# Patient Record
Sex: Female | Born: 1935 | Race: Black or African American | Hispanic: No | Smoking: Never smoker
Health system: Southern US, Community
[De-identification: ages and names within clinical notes are randomized; demographics above are authoritative.]

## PROBLEM LIST (undated history)

## (undated) DIAGNOSIS — H539 Unspecified visual disturbance: Secondary | ICD-10-CM

## (undated) DIAGNOSIS — H9190 Unspecified hearing loss, unspecified ear: Secondary | ICD-10-CM

## (undated) DIAGNOSIS — E079 Disorder of thyroid, unspecified: Secondary | ICD-10-CM

## (undated) DIAGNOSIS — K219 Gastro-esophageal reflux disease without esophagitis: Secondary | ICD-10-CM

## (undated) DIAGNOSIS — I10 Essential (primary) hypertension: Secondary | ICD-10-CM

## (undated) DIAGNOSIS — J45909 Unspecified asthma, uncomplicated: Secondary | ICD-10-CM

## (undated) DIAGNOSIS — C50919 Malignant neoplasm of unspecified site of unspecified female breast: Secondary | ICD-10-CM

## (undated) DIAGNOSIS — M1990 Unspecified osteoarthritis, unspecified site: Secondary | ICD-10-CM

## (undated) DIAGNOSIS — Z923 Personal history of irradiation: Secondary | ICD-10-CM

## (undated) DIAGNOSIS — Z8619 Personal history of other infectious and parasitic diseases: Secondary | ICD-10-CM

## (undated) DIAGNOSIS — E039 Hypothyroidism, unspecified: Secondary | ICD-10-CM

## (undated) DIAGNOSIS — G709 Myoneural disorder, unspecified: Secondary | ICD-10-CM

## (undated) DIAGNOSIS — R0602 Shortness of breath: Secondary | ICD-10-CM

## (undated) DIAGNOSIS — E785 Hyperlipidemia, unspecified: Secondary | ICD-10-CM

## (undated) DIAGNOSIS — E119 Type 2 diabetes mellitus without complications: Secondary | ICD-10-CM

## (undated) DIAGNOSIS — J189 Pneumonia, unspecified organism: Secondary | ICD-10-CM

## (undated) HISTORY — DX: Myoneural disorder, unspecified: G70.9

## (undated) HISTORY — PX: ELBOW SURGERY: SHX618

## (undated) HISTORY — PX: BACK SURGERY: SHX140

## (undated) HISTORY — DX: Malignant neoplasm of unspecified site of unspecified female breast: C50.919

## (undated) HISTORY — DX: Type 2 diabetes mellitus without complications: E11.9

## (undated) HISTORY — DX: Gastro-esophageal reflux disease without esophagitis: K21.9

## (undated) HISTORY — DX: Disorder of thyroid, unspecified: E07.9

## (undated) HISTORY — PX: CARPAL TUNNEL RELEASE: SHX101

## (undated) HISTORY — DX: Essential (primary) hypertension: I10

## (undated) HISTORY — PX: CERVICAL SPINE SURGERY: SHX589

## (undated) HISTORY — DX: Personal history of irradiation: Z92.3

## (undated) HISTORY — DX: Unspecified asthma, uncomplicated: J45.909

## (undated) HISTORY — DX: Unspecified visual disturbance: H53.9

## (undated) HISTORY — DX: Unspecified hearing loss, unspecified ear: H91.90

## (undated) HISTORY — PX: ROTATOR CUFF REPAIR: SHX139

## (undated) HISTORY — DX: Hyperlipidemia, unspecified: E78.5

## (undated) HISTORY — DX: Unspecified osteoarthritis, unspecified site: M19.90

## (undated) HISTORY — PX: ABDOMINAL HYSTERECTOMY: SHX81

---

## 1998-12-11 ENCOUNTER — Ambulatory Visit (HOSPITAL_BASED_OUTPATIENT_CLINIC_OR_DEPARTMENT_OTHER): Admission: RE | Admit: 1998-12-11 | Discharge: 1998-12-11 | Payer: Self-pay | Admitting: Orthopedic Surgery

## 1999-12-24 ENCOUNTER — Ambulatory Visit (HOSPITAL_COMMUNITY): Admission: RE | Admit: 1999-12-24 | Discharge: 1999-12-24 | Payer: Self-pay

## 2000-01-06 ENCOUNTER — Encounter: Admission: RE | Admit: 2000-01-06 | Discharge: 2000-01-06 | Payer: Self-pay | Admitting: *Deleted

## 2000-01-06 ENCOUNTER — Encounter: Payer: Self-pay | Admitting: *Deleted

## 2000-02-07 ENCOUNTER — Ambulatory Visit (HOSPITAL_COMMUNITY): Admission: RE | Admit: 2000-02-07 | Discharge: 2000-02-07 | Payer: Self-pay | Admitting: *Deleted

## 2000-04-06 ENCOUNTER — Encounter: Payer: Self-pay | Admitting: *Deleted

## 2000-04-06 ENCOUNTER — Ambulatory Visit (HOSPITAL_COMMUNITY): Admission: RE | Admit: 2000-04-06 | Discharge: 2000-04-06 | Payer: Self-pay | Admitting: *Deleted

## 2000-07-15 ENCOUNTER — Ambulatory Visit (HOSPITAL_COMMUNITY): Admission: RE | Admit: 2000-07-15 | Discharge: 2000-07-15 | Payer: Self-pay | Admitting: *Deleted

## 2001-04-19 ENCOUNTER — Encounter: Payer: Self-pay | Admitting: Endocrinology

## 2001-04-19 ENCOUNTER — Ambulatory Visit (HOSPITAL_COMMUNITY): Admission: RE | Admit: 2001-04-19 | Discharge: 2001-04-19 | Payer: Self-pay | Admitting: Endocrinology

## 2002-03-22 ENCOUNTER — Encounter: Payer: Self-pay | Admitting: Emergency Medicine

## 2002-03-22 ENCOUNTER — Emergency Department (HOSPITAL_COMMUNITY): Admission: EM | Admit: 2002-03-22 | Discharge: 2002-03-22 | Payer: Self-pay | Admitting: Emergency Medicine

## 2002-06-11 ENCOUNTER — Emergency Department (HOSPITAL_COMMUNITY): Admission: EM | Admit: 2002-06-11 | Discharge: 2002-06-11 | Payer: Self-pay | Admitting: Emergency Medicine

## 2002-07-16 ENCOUNTER — Emergency Department (HOSPITAL_COMMUNITY): Admission: EM | Admit: 2002-07-16 | Discharge: 2002-07-16 | Payer: Self-pay | Admitting: Emergency Medicine

## 2002-07-16 ENCOUNTER — Encounter: Payer: Self-pay | Admitting: Emergency Medicine

## 2004-07-09 ENCOUNTER — Encounter: Admission: RE | Admit: 2004-07-09 | Discharge: 2004-07-09 | Payer: Self-pay | Admitting: Endocrinology

## 2005-05-14 ENCOUNTER — Encounter: Admission: RE | Admit: 2005-05-14 | Discharge: 2005-05-14 | Payer: Self-pay | Admitting: Neurology

## 2005-08-17 ENCOUNTER — Emergency Department (HOSPITAL_COMMUNITY): Admission: EM | Admit: 2005-08-17 | Discharge: 2005-08-17 | Payer: Self-pay | Admitting: Emergency Medicine

## 2006-06-02 ENCOUNTER — Encounter: Admission: RE | Admit: 2006-06-02 | Discharge: 2006-06-02 | Payer: Self-pay | Admitting: Orthopedic Surgery

## 2007-07-20 ENCOUNTER — Encounter: Admission: RE | Admit: 2007-07-20 | Discharge: 2007-07-20 | Payer: Self-pay | Admitting: *Deleted

## 2009-12-24 ENCOUNTER — Encounter: Admission: RE | Admit: 2009-12-24 | Discharge: 2009-12-24 | Payer: Self-pay | Admitting: Endocrinology

## 2010-05-07 ENCOUNTER — Observation Stay (HOSPITAL_COMMUNITY): Admission: EM | Admit: 2010-05-07 | Discharge: 2010-05-08 | Payer: Self-pay | Admitting: Emergency Medicine

## 2010-05-08 ENCOUNTER — Encounter (INDEPENDENT_AMBULATORY_CARE_PROVIDER_SITE_OTHER): Payer: Self-pay | Admitting: Internal Medicine

## 2010-05-28 ENCOUNTER — Encounter: Admission: RE | Admit: 2010-05-28 | Discharge: 2010-05-28 | Payer: Self-pay | Admitting: Endocrinology

## 2010-09-03 ENCOUNTER — Encounter: Admission: RE | Admit: 2010-09-03 | Discharge: 2010-09-03 | Payer: Self-pay | Admitting: Endocrinology

## 2010-10-08 ENCOUNTER — Encounter: Admission: RE | Admit: 2010-10-08 | Discharge: 2010-10-08 | Payer: Self-pay | Admitting: Endocrinology

## 2010-10-08 ENCOUNTER — Other Ambulatory Visit
Admission: RE | Admit: 2010-10-08 | Discharge: 2010-10-08 | Payer: Self-pay | Source: Home / Self Care | Admitting: Interventional Radiology

## 2010-12-28 IMAGING — CT CT CHEST W/ CM
2 of 3 series · 15 of 36 positions shown, 18 images · IV contrast (75CC OMNI 300)
Comparison: CT angio chest of 05/07/2010, chest x-ray of 05/07/2010

CLINICAL DATA: Short of breath,  questionable right hilar lesion on
chest x-ray

CT CHEST WITH CONTRAST
TECHNIQUE: Multidetector CT imaging of the chest was performed
following the standard protocol during bolus administration of
intravenous contrast.
Contrast: 75 ml 1mnipaque-HTT

[Series 2: routine chest · axial · 0.62mm/px · z∈[-178,+27]mm · 12 of 49 slices shown, 15 images]
[im 4/49  mediastinal]
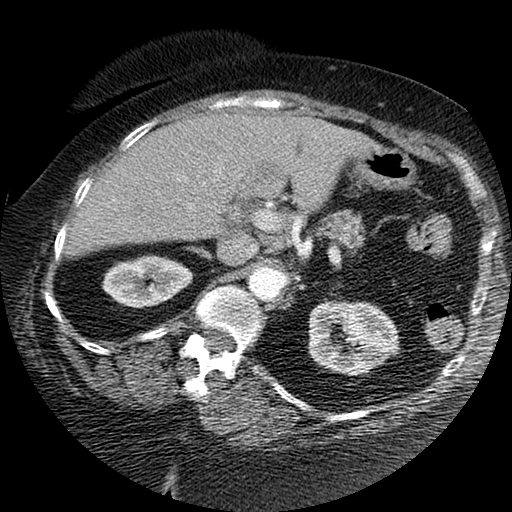
[im 4/49  lung]
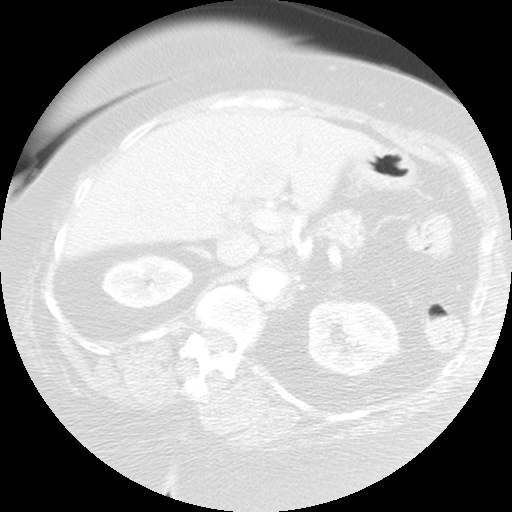
[im 8/49  lung]
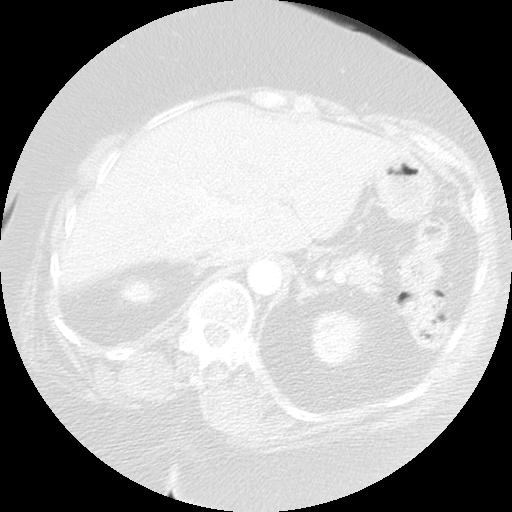
[im 11/49  lung]
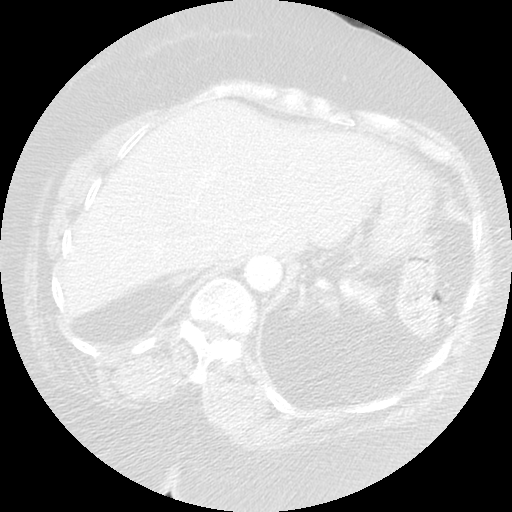
[im 15/49  lung]
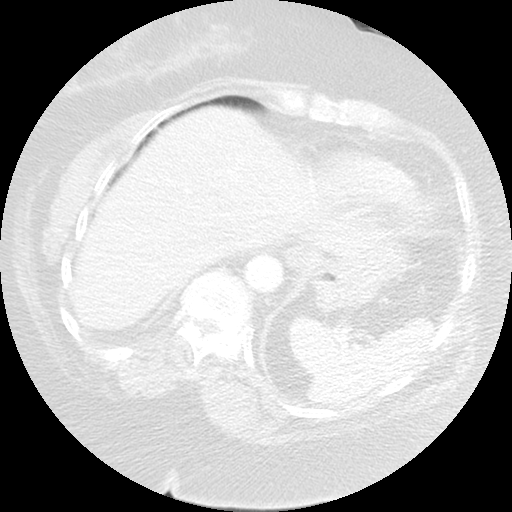
[im 18/49  mediastinal]
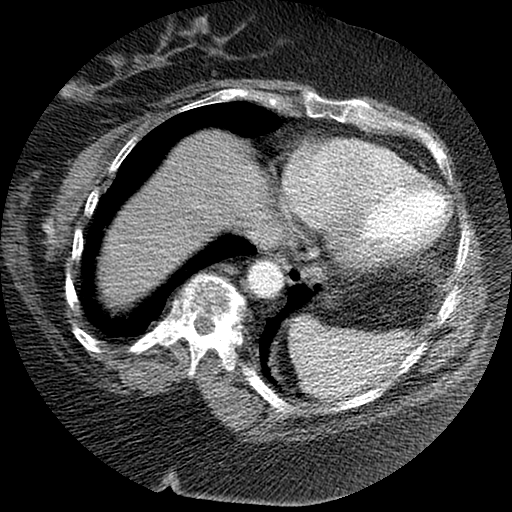
[im 18/49  lung]
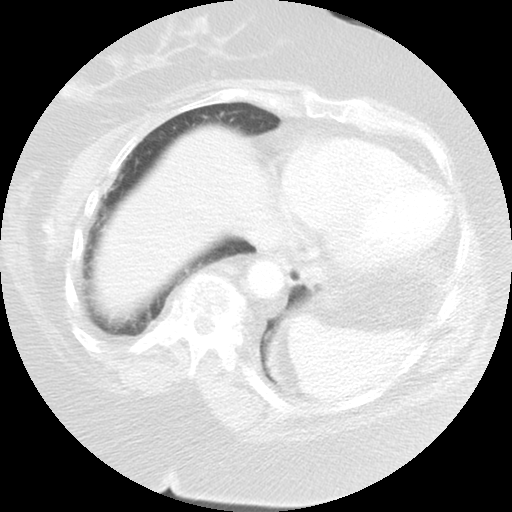
[im 22/49  lung]
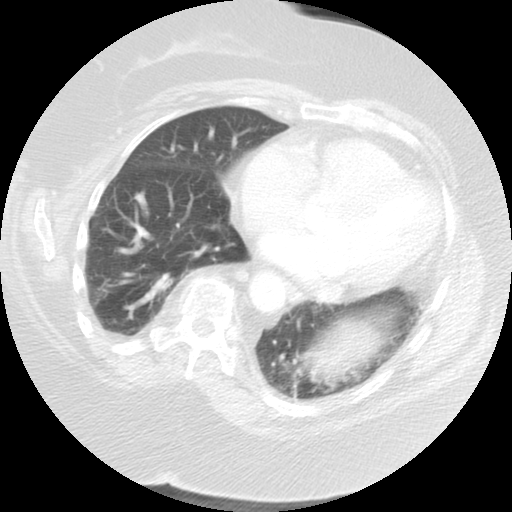
[im 27/49  lung]
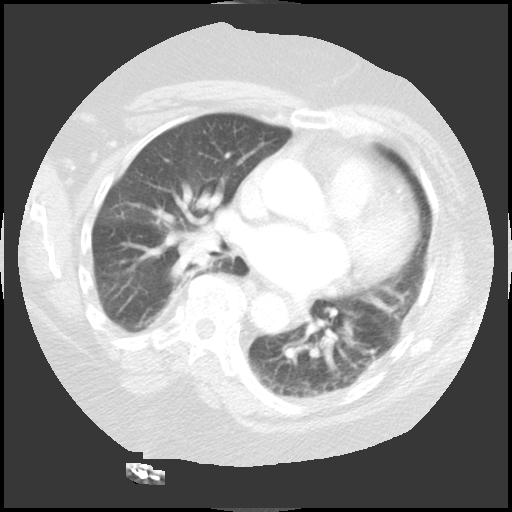
[im 31/49  lung]
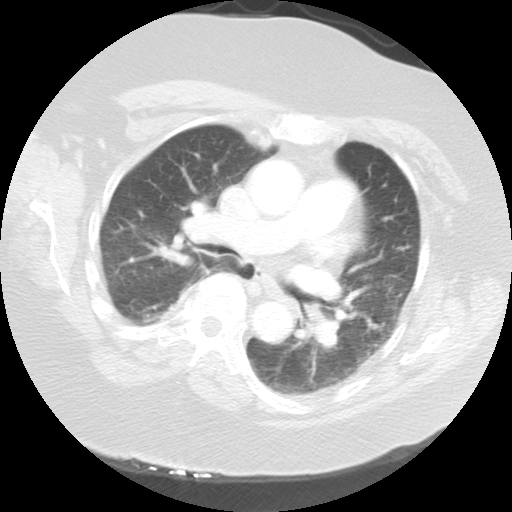
[im 34/49  mediastinal]
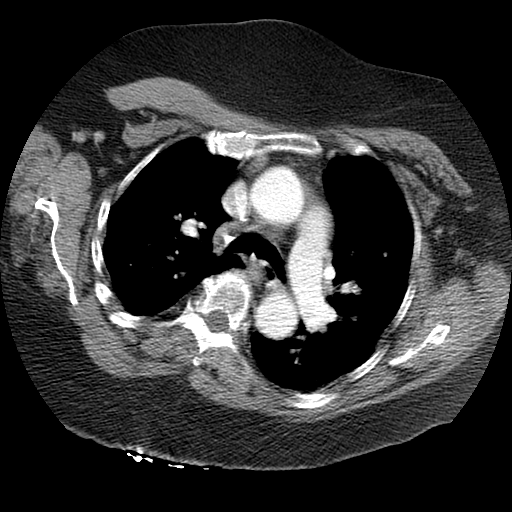
[im 34/49  lung]
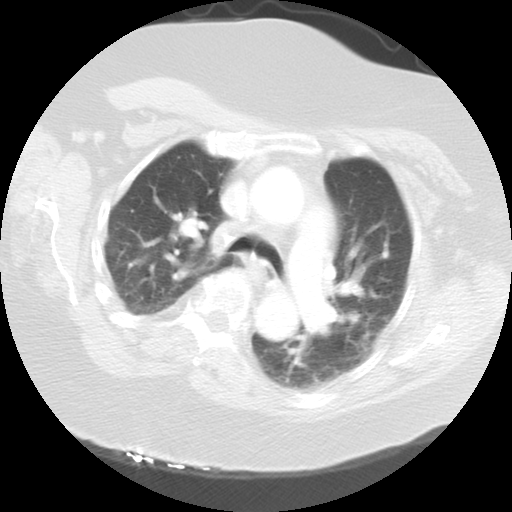
[im 38/49  lung]
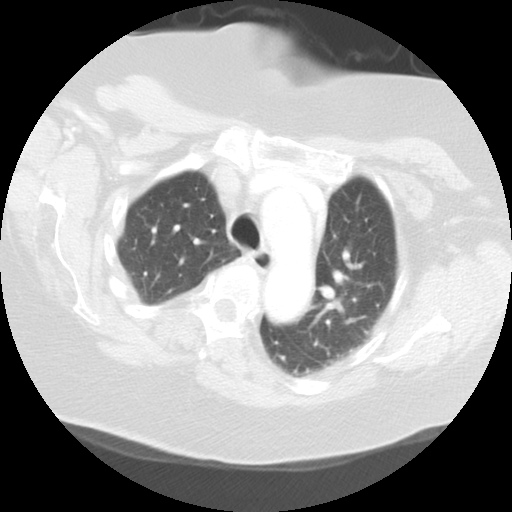
[im 41/49  lung]
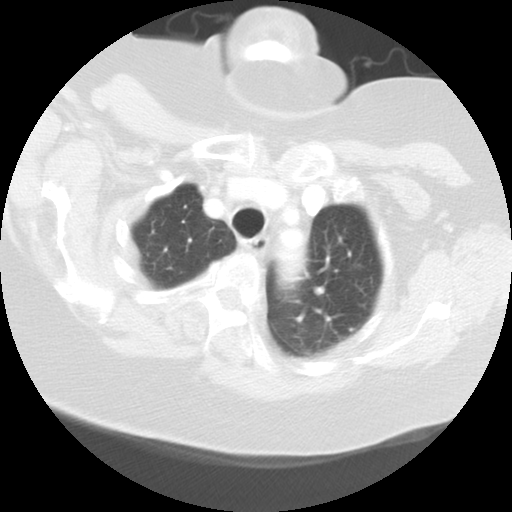
[im 45/49  lung]
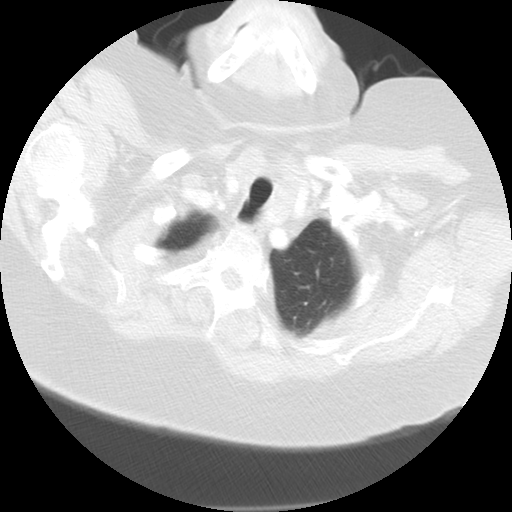

[Series 400: cor · coronal · 0.62mm/px · 3 of 120 slices shown]
[im 24/120  lung]
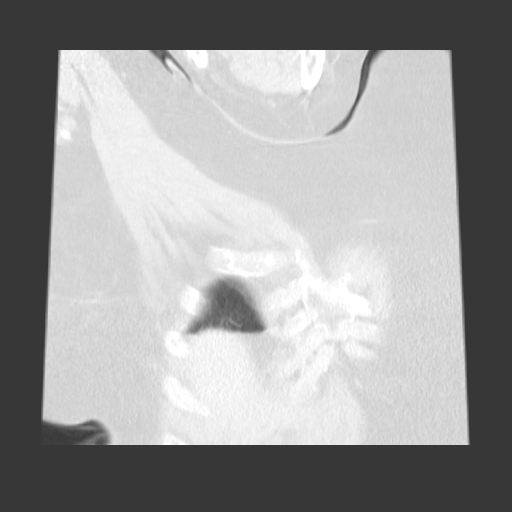
[im 48/120  lung]
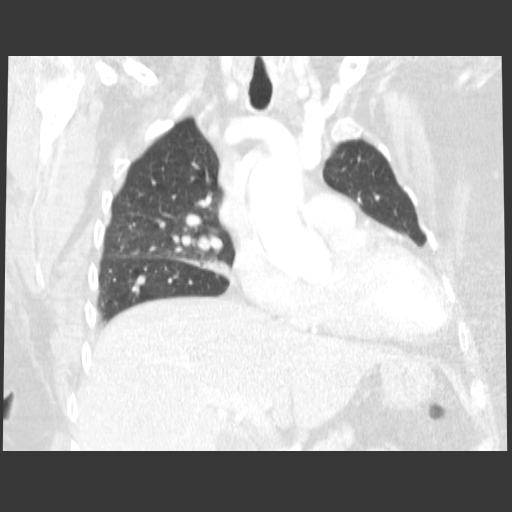
[im 72/120  lung]
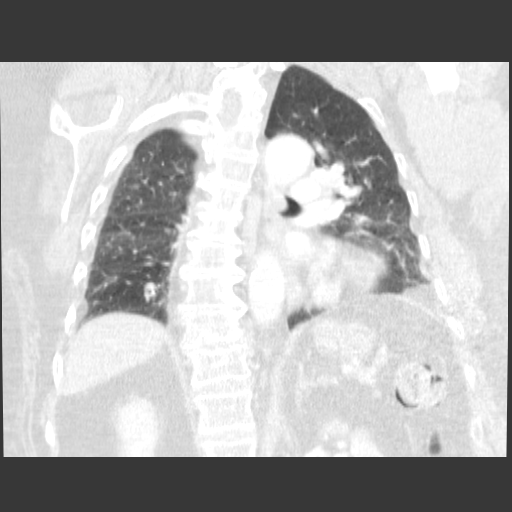

[15 of 36 positions shown; findings below may reference images not displayed]

FINDINGS: On the lung window images, no infiltrate, suspicious lung
mass, or pleural effusion is seen.  No bony abnormality is seen.

On the soft tissue window images, the pulmonary arteries and
thoracic aorta opacify with no significant abnormality noted.  No
hilar mass is seen.  There is mild cardiomegaly present.  No
mediastinal or hilar adenopathy is noted.  The upper abdomen is
unremarkable.
IMPRESSION: Negative CT of the chest.  No mass or adenopathy is seen.
Cardiomegaly.

## 2011-01-19 LAB — HEMOGLOBIN A1C
Hgb A1c MFr Bld: 6.3 % — ABNORMAL HIGH (ref <5.7)
Mean Plasma Glucose: 134 mg/dL — ABNORMAL HIGH (ref <117)

## 2011-01-19 LAB — CARDIAC PANEL(CRET KIN+CKTOT+MB+TROPI)
CK, MB: 1.1 ng/mL (ref 0.3–4.0)
CK, MB: 1.1 ng/mL (ref 0.3–4.0)
Relative Index: 0.9 (ref 0.0–2.5)

## 2011-01-19 LAB — POCT CARDIAC MARKERS
CKMB, poc: <1 ng/mL — ABNORMAL LOW (ref 1.0–8.0)
CKMB, poc: <1 ng/mL — ABNORMAL LOW (ref 1.0–8.0)
Myoglobin, poc: 139 ng/mL (ref 12–200)
Myoglobin, poc: 81.7 ng/mL (ref 12–200)
Troponin i, poc: <0.05 ng/mL (ref 0.00–0.09)
Troponin i, poc: <0.05 ng/mL (ref 0.00–0.09)

## 2011-01-19 LAB — BASIC METABOLIC PANEL
BUN: 9 mg/dL (ref 6–23)
CO2: 30 mEq/L (ref 19–32)
CO2: 32 mEq/L (ref 19–32)
Calcium: 8.9 mg/dL (ref 8.4–10.5)
Calcium: 9.4 mg/dL (ref 8.4–10.5)
Chloride: 105 mEq/L (ref 96–112)
Chloride: 108 mEq/L (ref 96–112)
Creatinine, Ser: 0.85 mg/dL (ref 0.4–1.2)
GFR calc Af Amer: >60 mL/min (ref >60)
GFR calc non Af Amer: >60 mL/min (ref >60)
Glucose, Bld: 111 mg/dL — ABNORMAL HIGH (ref 70–99)
Glucose, Bld: 123 mg/dL — ABNORMAL HIGH (ref 70–99)
Potassium: 2.9 mEq/L — ABNORMAL LOW (ref 3.5–5.1)
Potassium: 3.7 mEq/L (ref 3.5–5.1)
Sodium: 141 mEq/L (ref 135–145)
Sodium: 144 mEq/L (ref 135–145)

## 2011-01-19 LAB — DIFFERENTIAL
Basophils Absolute: 0 10*3/uL (ref 0.0–0.1)
Basophils Relative: 0 % (ref 0–1)
Eosinophils Absolute: 0.1 10*3/uL (ref 0.0–0.7)
Eosinophils Relative: 2 % (ref 0–5)
Lymphocytes Relative: 38 % (ref 12–46)
Lymphs Abs: 1.4 10*3/uL (ref 0.7–4.0)
Monocytes Absolute: 0.2 10*3/uL (ref 0.1–1.0)
Monocytes Relative: 6 % (ref 3–12)
Neutro Abs: 2 10*3/uL (ref 1.7–7.7)
Neutrophils Relative %: 53 % (ref 43–77)

## 2011-01-19 LAB — CBC
HCT: 44 % (ref 36.0–46.0)
Hemoglobin: 12.8 g/dL (ref 12.0–15.0)
Hemoglobin: 14.7 g/dL (ref 12.0–15.0)
MCH: 31.4 pg (ref 26.0–34.0)
MCHC: 33.4 g/dL (ref 30.0–36.0)
MCV: 94.1 fL (ref 78.0–100.0)
Platelets: 150 10*3/uL (ref 150–400)
RBC: 4.05 MIL/uL (ref 3.87–5.11)
RBC: 4.68 MIL/uL (ref 3.87–5.11)
RDW: 14.8 % (ref 11.5–15.5)
WBC: 3.8 10*3/uL — ABNORMAL LOW (ref 4.0–10.5)
WBC: 5 10*3/uL (ref 4.0–10.5)

## 2011-01-19 LAB — LIPID PANEL
Cholesterol: 141 mg/dL (ref 0–200)
HDL: 92 mg/dL (ref >39)
LDL Cholesterol: 35 mg/dL (ref 0–99)
Total CHOL/HDL Ratio: 1.5 RATIO
Triglycerides: 69 mg/dL (ref <150)

## 2011-01-19 LAB — CK TOTAL AND CKMB (NOT AT ARMC)
CK, MB: 1 ng/mL (ref 0.3–4.0)
Relative Index: 0.9 (ref 0.0–2.5)
Total CK: 107 U/L (ref 7–177)

## 2011-01-19 LAB — MAGNESIUM: Magnesium: 2.2 mg/dL (ref 1.5–2.5)

## 2011-01-19 LAB — GLUCOSE, CAPILLARY: Glucose-Capillary: 126 mg/dL — ABNORMAL HIGH (ref 70–99)

## 2011-01-19 LAB — BRAIN NATRIURETIC PEPTIDE: Pro B Natriuretic peptide (BNP): <30 pg/mL (ref 0.0–100.0)

## 2011-01-19 LAB — TSH: TSH: 0.1 u[IU]/mL — ABNORMAL LOW (ref 0.350–4.500)

## 2011-03-21 NOTE — Op Note (Signed)
[place]. [facility]  Patient:    Amber [name], Amber [name]                    MRN: [id number] Proc. Date: 02/07/00 Adm. Date:  [id number] Attending:  Sabino Gasser                           Operative Report  PROCEDURE:  Upper Endoscopy.  GASTROENTEROLOGIST:  Sabino Gasser, M.D.  INDICATIONS:  Abdominal pain, early satiety, weight loss.  ANESTHESIA:  Demerol 40 mg, Versed 5 mg given intravenously in divided dose.  PROCEDURE IN DETAIL:  With the patient mildly sedated and in the left lateral decubitus position, the Olympus video endoscope was inserted in the mouth, passed under direct vision through the esophagus, which appeared normal, into the stomach, which appeared normal.  The endoscope was then pulled back to the distal esophagus at the GE junction.   This area appeared normal.  The Z-line was not well seen, but no inflammatory changes were noted, and this was photographed.  We entered into the stomach further.  The antrum, duodenal bulb, second portion duodenum all appeared normal, and the ampulla was well seen and photographed as well.  The endoscope as then slowly withdrawn, taking circumferential views of the entire duodenum mucosa. The endoscope was pulled back in the stomach and placed in retroflexion to view the stomach from below, and this also appeared normal and was photographed.  The endoscope was then straightened and pulled back from distal to proximal stomach, taking circumferential views of the remaining gastric and esophageal mucosa. The patients vital signs and pulse oximetry remained stable.  The patient tolerated  the procedure well with no apparent complications.  FINDINGS:  Essentially normal endoscopic examination.  PLAN:  The patient may need ERCP to evaluate further. DD:  02/07/00 TD:  02/08/00 Job: [id number]

## 2011-03-21 NOTE — Procedures (Signed)
[facility]  Patient:    Amber [name], Amber [name]                    MRN: [id number] Proc. Date: 04/06/00 Adm. Date:  [id number] Attending:  Sabino Gasser                           Procedure Report  PROCEDURE PERFORMED:  Endoscopic retrograde cholangiopancreatography.  ENDOSCOPIST:  Sabino Gasser, M.D.  INDICATIONS FOR PROCEDURE:  Weight loss, dilated bile duct.  ANESTHESIA:  Demerol 120 mg, Versed 11.5 mg were given intravenously in divided dose.  Glucagon 0.5 mg was given intravenously during the procedure.  DESCRIPTION OF PROCEDURE:  With the patient mildly sedated in room 7 of radiology in the left prone position, the Olympus sideviewing duodenoscope was inserted in the mouth and passed under direct vision through the esophagus into the stomach.  The antrum was approached and appeared normal.  We advanced to the duodenal bulb and second portion of the duodenum where the endoscope was straightened and pulled back to approach the papilla of Vater.  Once accomplished, a Wilson-Cook tritome catheter was advanced and cannulation of the duct occurred.  We injected contrast material and it first showed pancreatic duct and after a number of times of trying to attempt to pass a guide wire and with change in position of the endoscope, we were subsequently able to inject contrast materials to outline the common bile duct which appeared grossly normal.  We passed a guide wire and followed it with the catheter, injected constrast material, then under the guidance of Dr. Stevphen Meuse of radiology and a normal outline of the biliary tree occurred other than gallstones.  The common bile duct specifically appeared normal without filling defects or strictures.  At that point, the endoscope, guide wire and catheter were all removed and it was noted that there was some blood seen in the stomach.  Therefore with removal of this endoscope, a second procedure  was performed.  FINDINGS AT THIS TIME:  Essentially normal common bile duct and pancreatic duct without evidence of dilation on ERCP.  PLAN:  Will have the patient follow up with me as an outpatient, proceed with endoscopy as well. DD:  04/06/00 TD:  04/08/00 Job: [id number]

## 2011-03-21 NOTE — Procedures (Signed)
[facility]  Patient:    Amber [name], Amber [name]                    MRN: [id number] Proc. Date: 07/15/00 Adm. Date:  [id number] Attending:  Sabino Gasser                           Procedure Report  PROCEDURE PERFORMED:  Colonoscopy.  ENDOSCOPIST:  Sabino Gasser, M.D.  INDICATIONS FOR PROCEDURE:  Rectal bleeding.  ANESTHESIA:  Demerol 90 mg, Versed 9 mg.  DESCRIPTION OF PROCEDURE:  With the patient mildly sedated in the left lateral decubitus position, rectal examination was performed which revealed hemorrhoids.  Subsequently, the videoscopic colonoscope was inserted in the rectum and passed under direct vision into the cecum.  The cecum was identified by the ileocecal valve and appendiceal orifice, both of which were photographed.  From this point, the colonoscope was slowly withdrawn, taking circumferential views of the entire colonic mucosa, stopping in the rectum, which appeared normal on direct view and showed internal hemorrhoids on retroflex view.  The endoscope was straightened and withdrawn.  Patients vital signs and pulse oximeter remained stable.  The patient tolerated the procedure well and without apparent complications.  FINDINGS:  Internal hemorrhoids.  Otherwise unremarkable colonoscopic examination to the cecum.  PLAN:  Follow-up with me as needed. DD:  07/15/00 TD:  07/16/00 Job: [id number]

## 2011-03-21 NOTE — Procedures (Signed)
[facility]  Patient:    Amber [name], Amber [name]                    MRN: [id number] Adm. Date:  [id number] Attending:  Sabino Gasser                           Procedure Report  PROCEDURE:  Upper endoscopy.  ENDOSCOPIST:  Sabino Gasser, M.D.  INDICATION:  GI bleeding.  ANESTHESIA:  None for the given procedure.  DESCRIPTION OF PROCEDURE:  With patient mildly sedated in the left lateral decubitus position, the Olympus videoscopic endoscope was inserted in the mouth, passed under direct vision through the esophagus, which showed traces of some blood.  We entered into the stomach; there was some blood seen in the fundus but GE junction appeared normal without evidence of tear.  The endoscope was then advanced after straightening to view the pylorus, duodenal bulb, second portion of duodenum; no blood was seen here.  The endoscope was then withdrawn, taking circumferential views of the duodenal, gastric and esophageal mucosa and in the pharynx as well.  No bleeding was seen actively. There was some blood in the esophagus and in the stomach, presumed from mild trauma from passage of the scope.  Patients vital signs and pulse oximetry remained stable.  Patient tolerated the procedure well without apparent complications.  FINDINGS:  Minimal amount of blood in stomach, presumably from mouth trauma from passage of the side-viewing scope or tightness of the bite block.  PLAN:  No specific followup indicated at this time. DD:  04/06/00 TD:  04/08/00 Job: [id number]

## 2011-11-04 DIAGNOSIS — C50919 Malignant neoplasm of unspecified site of unspecified female breast: Secondary | ICD-10-CM

## 2011-11-04 HISTORY — DX: Malignant neoplasm of unspecified site of unspecified female breast: C50.919

## 2012-10-19 ENCOUNTER — Other Ambulatory Visit: Payer: Self-pay | Admitting: Radiology

## 2012-10-22 ENCOUNTER — Encounter (INDEPENDENT_AMBULATORY_CARE_PROVIDER_SITE_OTHER): Payer: Self-pay | Admitting: General Surgery

## 2012-10-22 ENCOUNTER — Ambulatory Visit (INDEPENDENT_AMBULATORY_CARE_PROVIDER_SITE_OTHER): Payer: Medicare Other | Admitting: General Surgery

## 2012-10-22 VITALS — BP 132/80 | HR 74 | Temp 97.4°F | Resp 16 | Ht 62.0 in | Wt 192.4 lb

## 2012-10-22 DIAGNOSIS — C50919 Malignant neoplasm of unspecified site of unspecified female breast: Secondary | ICD-10-CM

## 2012-10-22 NOTE — Progress Notes (Signed)
Patient ID: Amber [name], female   DOB: [birth year], 78 y.o.   MRN: [id number]  Chief Complaint  Patient presents with  . Breast Cancer    HPI Amber [name] is a 78 y.o. female.  this patient is referred by [facility] breast imaging for evaluation of a newly diagnosed right breast cancer. This was found on routine screening mammograms earlier this week. She says that she has not noticed any masses in the breast or any changes with her breast but she does not routinely do self breast exams either. Her last mammogram was about 4 years prior to this. She has lost about 20 pounds in the last year and complains of a lot of arthritis but no abnormal headaches. She is not on any hormone pills and she denies any family history of breast cancer. Her mother did have uterine cancer she did have a brother with oral cancer but no breast or ovarian.  She also had a biopsy of of a right axillary lymph node which was positive for metastatic carcinoma as well.HPI  Past Medical History  Diagnosis Date  . Arthritis   . Asthma   . Cancer     breast  . Diabetes mellitus without complication   . GERD (gastroesophageal reflux disease)   . Hyperlipidemia   . Hypertension   . Neuromuscular disorder   . Thyroid disease   . Hearing loss   . Visual disturbance     Past Surgical History  Procedure Date  . Back surgery 1980 - approximate  . Carpal tunnel release unsure of date    both hands  . Rotator cuff repair unsure of date    not sure which shoulder  . Elbow surgery unsure of date  . Cervical spine surgery unsure of date    Family History  Problem Relation Age of Onset  . Cancer Mother     uterine  . Cancer Brother     oral, throat    Social History History  Substance Use Topics  . Smoking status: Never Smoker   . Smokeless tobacco: Never Used  . Alcohol Use: No    No Known Allergies  Current Outpatient Prescriptions  Medication Sig Dispense Refill  . ADVAIR DISKUS 250-50 MCG/DOSE  AEPB as needed.      Marland Kitchen amLODipine (NORVASC) 5 MG tablet Daily.      . diazepam (VALIUM) 5 MG tablet Three times daily as needed.      . furosemide (LASIX) 80 MG tablet Daily.      . potassium chloride SA (K-DUR,KLOR-CON) 20 MEQ tablet Daily.      . pravastatin (PRAVACHOL) 40 MG tablet Daily.      Marland Kitchen PROAIR HFA 108 (90 BASE) MCG/ACT inhaler as needed.      . promethazine (PHENERGAN) 25 MG tablet Take 25 mg by mouth every 12 (twelve) hours as needed. Take 1/2 or 1 whole table per dosage as needed.      . propranolol (INDERAL) 40 MG tablet Daily.      Marland Kitchen SYNTHROID 150 MCG tablet Daily.      . traMADol (ULTRAM) 50 MG tablet Every 6 hours as needed.        Review of Systems Review of Systems All other review of systems negative or noncontributory except as stated in the HPI ] Blood pressure 132/80, pulse 74, temperature 97.4 F (36.3 C), temperature source Temporal, resp. rate 16, height 5\' 2"  (1.575 m), weight 192 lb 6 oz (87.261 kg).  Physical Exam  Physical Exam Physical Exam  Nursing note and vitals reviewed. Constitutional: She is oriented to person, place, and time. She appears well-developed and well-nourished. No distress.  HENT:  Head: Normocephalic and atraumatic.  Mouth/Throat: No oropharyngeal exudate.  Eyes: Conjunctivae and EOM are normal. Pupils are equal, round, and reactive to light. Right eye exhibits no discharge. Left eye exhibits no discharge. No scleral icterus.  Neck: Normal range of motion. Neck supple. No tracheal deviation present.  Cardiovascular: Normal rate, regular rhythm, normal heart sounds and intact distal pulses.   Pulmonary/Chest: Effort normal and breath sounds normal. No stridor. No respiratory distress. She has no wheezes.  Abdominal: Soft. Bowel sounds are normal. She exhibits no distension and no mass. There is no tenderness. There is no rebound and no guarding.  Musculoskeletal: Normal range of motion. She exhibits no edema and no tenderness.   Neurological: She is alert and oriented to person, place, and time.  Skin: Skin is warm and dry. No rash noted. She is not diaphoretic. No erythema. No pallor.  Psychiatric: She has a normal mood and affect. Her behavior is normal. Judgment and thought content normal.  Breast: left breast has some bruisiing and tenderness in the RUO quadrant and I do think that I can feel a mass in this area but exam is limited by discomfort.  She also has some nodularity in the axilla likely consistend with LAD.   The right breast does not show any suspicious skin changes or masses or LAD.   Data Reviewed Path, mammo, [place] images  Assessment    Right breast cancer She has a known right breast cancer with a positive axillary node. Her receptor status is still pending. This is not a straightforward case given her lymph node positivity and her age and functional status. We had a long discussion regarding the surgical options for treatment.  We also discussed the options of chemo, hormones, and radiation.  I explained that the standard guidelines would be for either lumpectomy with ALND or MRM but given her age and comorbidities, I am not sure that this is the best option for her.  I have recommended staging workup given nodal disease and if positive for mets, then I would probably not do any surgery at all.  If negative, we will need to consider lumpectomy with or without nodal dissection with adjuvant treatments such as hormones and or radiation.  Receptor status still pending.  We will discuss her at breast conference and with medical and radiation oncology.   She did have 2 daughters and her son present with her.  She did authorize me to discuss her care with her daughters and son.    Plan    Await staging workup and receptor status.  Discuss with medical and radiation oncology       Lodema Pilot DAVID 10/22/2012, 11:46 AM

## 2012-10-28 ENCOUNTER — Other Ambulatory Visit (INDEPENDENT_AMBULATORY_CARE_PROVIDER_SITE_OTHER): Payer: Self-pay

## 2012-10-28 DIAGNOSIS — D0590 Unspecified type of carcinoma in situ of unspecified breast: Secondary | ICD-10-CM

## 2012-10-29 ENCOUNTER — Other Ambulatory Visit (INDEPENDENT_AMBULATORY_CARE_PROVIDER_SITE_OTHER): Payer: Self-pay

## 2012-10-29 ENCOUNTER — Telehealth (INDEPENDENT_AMBULATORY_CARE_PROVIDER_SITE_OTHER): Payer: Self-pay | Admitting: General Surgery

## 2012-10-29 DIAGNOSIS — C50919 Malignant neoplasm of unspecified site of unspecified female breast: Secondary | ICD-10-CM

## 2012-10-29 NOTE — Telephone Encounter (Signed)
Called patient to give her appt date and time for PET scan which is 11/10/12 @10 :15 and NPO 6hrs prior...gave patient instructions and phone number to [facility] radiology if the appt turned out not to work with her schd..patient was ok with instructions and said that she would let us know if she had to R/S her appt

## 2012-11-01 ENCOUNTER — Other Ambulatory Visit (INDEPENDENT_AMBULATORY_CARE_PROVIDER_SITE_OTHER): Payer: Self-pay

## 2012-11-01 DIAGNOSIS — C50919 Malignant neoplasm of unspecified site of unspecified female breast: Secondary | ICD-10-CM

## 2012-11-09 ENCOUNTER — Ambulatory Visit
Admission: RE | Admit: 2012-11-09 | Discharge: 2012-11-09 | Disposition: A | Discharge status: Home / Self Care | Payer: Medicare Other | Source: Ambulatory Visit | Attending: General Surgery | Admitting: General Surgery

## 2012-11-09 DIAGNOSIS — C50919 Malignant neoplasm of unspecified site of unspecified female breast: Secondary | ICD-10-CM

## 2012-11-09 MED ORDER — GADOBENATE DIMEGLUMINE 529 MG/ML IV SOLN
17.0000 mL | Freq: Once | INTRAVENOUS | Status: AC | PRN
  Administered 2012-11-09: 17 mL via INTRAVENOUS

## 2012-11-10 ENCOUNTER — Other Ambulatory Visit (INDEPENDENT_AMBULATORY_CARE_PROVIDER_SITE_OTHER): Payer: Self-pay | Admitting: General Surgery

## 2012-11-10 ENCOUNTER — Telehealth: Payer: Self-pay | Admitting: *Deleted

## 2012-11-10 ENCOUNTER — Ambulatory Visit (HOSPITAL_COMMUNITY): Payer: Medicare PPO

## 2012-11-10 DIAGNOSIS — C50911 Malignant neoplasm of unspecified site of right female breast: Secondary | ICD-10-CM

## 2012-11-10 NOTE — Progress Notes (Signed)
I discussed the MRI findings with the patient's daughter and we again discussed the options.  We have discussed her care at breast conference this am and we have decided to proceed with right lumpectomy with excision of the positive node and limited axillary dissection.  She will then receive radiation to breast and axilla.  We did not feel that given her age and DM that full axillary dissection would confer much benefit and risk lymphedema or nerve injury.  She agreed with this plan.  I have also discussed this with Dr. Tilda Burrow who agrees with the plan and will localize the axillary lymph node for excision.

## 2012-11-10 NOTE — Telephone Encounter (Signed)
Confirmed 11/22/12 appt w/ pt's daughter Steward Drone.  Mailed before appt letter & packet to pt.  Emailed Christy at [facility] to make aware.  Took

## 2012-11-10 NOTE — Telephone Encounter (Signed)
Confirmed 11/22/12 appt w/ pt's daughter Steward Drone.  Mailed before appt letter & packet to pt.  Emailed Christy at [facility] to make aware.  Took paperwork to Med Rec for chart.

## 2012-11-12 ENCOUNTER — Telehealth (INDEPENDENT_AMBULATORY_CARE_PROVIDER_SITE_OTHER): Payer: Self-pay | Admitting: General Surgery

## 2012-11-12 ENCOUNTER — Telehealth: Payer: Self-pay | Admitting: *Deleted

## 2012-11-12 NOTE — Telephone Encounter (Signed)
Pt's son calling from out-of-town for with questions for Dr. Biagio Quint on his mother's plan of care.  He attended her initial appt where certain plans were made.  His sister called him to let him know about the surgery scheduled for 11/22/12.  He has questions for Dr. Biagio Quint about this, as his sister could not remember a lot of what was said.  He was asking about the whole body scan that was cancelled, etc.  Please call son:  [phone]

## 2012-11-12 NOTE — Telephone Encounter (Signed)
Pt's daughter Steward Drone) called stating that she is scheduled to have surgery on the same day she is scheduled to see Dr. Welton Flakes.  Rescheduled and confirmed 12/06/12 appt w/ pts daugther Steward Drone).  Emailed Christy at [facility] to make her aware.

## 2012-11-15 ENCOUNTER — Encounter (HOSPITAL_COMMUNITY): Payer: Self-pay | Admitting: Pharmacy Technician

## 2012-11-16 ENCOUNTER — Encounter (HOSPITAL_COMMUNITY)
Admission: RE | Admit: 2012-11-16 | Discharge: 2012-11-16 | Disposition: A | Discharge status: Home / Self Care | Payer: Medicare PPO | Source: Ambulatory Visit | Attending: General Surgery | Admitting: General Surgery

## 2012-11-16 ENCOUNTER — Other Ambulatory Visit: Payer: Self-pay | Admitting: *Deleted

## 2012-11-16 DIAGNOSIS — C50919 Malignant neoplasm of unspecified site of unspecified female breast: Secondary | ICD-10-CM | POA: Insufficient documentation

## 2012-11-16 DIAGNOSIS — C50419 Malignant neoplasm of upper-outer quadrant of unspecified female breast: Secondary | ICD-10-CM

## 2012-11-16 DIAGNOSIS — C50411 Malignant neoplasm of upper-outer quadrant of right female breast: Secondary | ICD-10-CM | POA: Insufficient documentation

## 2012-11-16 LAB — GLUCOSE, CAPILLARY: Glucose-Capillary: 102 mg/dL — ABNORMAL HIGH (ref 70–99)

## 2012-11-16 MED ORDER — FLUDEOXYGLUCOSE F - 18 (FDG) INJECTION
18.9000 | Freq: Once | INTRAVENOUS | Status: AC | PRN
  Administered 2012-11-16: 18.9 via INTRAVENOUS

## 2012-11-18 ENCOUNTER — Encounter (HOSPITAL_COMMUNITY)
Admission: RE | Admit: 2012-11-18 | Discharge: 2012-11-18 | Disposition: A | Discharge status: Home / Self Care | Payer: Medicare PPO | Source: Ambulatory Visit | Attending: General Surgery | Admitting: General Surgery

## 2012-11-18 ENCOUNTER — Encounter (HOSPITAL_COMMUNITY): Payer: Self-pay

## 2012-11-18 VITALS — BP 125/62 | HR 69 | Temp 97.8°F | Resp 20 | Ht 62.0 in | Wt 206.0 lb

## 2012-11-18 DIAGNOSIS — C50911 Malignant neoplasm of unspecified site of right female breast: Secondary | ICD-10-CM

## 2012-11-18 HISTORY — DX: Hypothyroidism, unspecified: E03.9

## 2012-11-18 LAB — BASIC METABOLIC PANEL
Calcium: 10.1 mg/dL (ref 8.4–10.5)
GFR calc Af Amer: 67 mL/min — ABNORMAL LOW (ref >90)
GFR calc non Af Amer: 58 mL/min — ABNORMAL LOW (ref >90)
Glucose, Bld: 111 mg/dL — ABNORMAL HIGH (ref 70–99)
Sodium: 147 mEq/L — ABNORMAL HIGH (ref 135–145)

## 2012-11-18 LAB — CBC
MCH: 31.3 pg (ref 26.0–34.0)
Platelets: 177 10*3/uL (ref 150–400)
RBC: 4.67 MIL/uL (ref 3.87–5.11)

## 2012-11-18 LAB — SURGICAL PCR SCREEN
MRSA, PCR: NEGATIVE
Staphylococcus aureus: NEGATIVE

## 2012-11-18 NOTE — Telephone Encounter (Signed)
Have you spoke to patient or her son?  Please advise if I still need to call him or not.

## 2012-11-18 NOTE — Pre-Procedure Instructions (Signed)
Amber [name]  11/18/2012   Your procedure is scheduled on:  Monday November 22, 2012  Report to [facility] at 1215 PM.  Call this number if you have problems the morning of surgery: [phone]   Remember:   Do not eat food or drink liquids after midnight.   Take these medicines the morning of surgery with A SIP OF WATER: Amlodipine, Diazepam if needed, Propranolol, Synthyroid and Tramadol if needed. Bring inhalers Advair and Proair.   Do not wear jewelry, make-up or nail polish.  Do not wear lotions, powders, or perfumes. You may wear deodorant.  Do not shave 48 hours prior to surgery.   Do not bring valuables to the hospital.  Contacts, dentures or bridgework may not be worn into surgery.  Leave suitcase in the car. After surgery it may be brought to your room.  For patients admitted to the hospital, checkout time is 11:00 AM the day of  discharge.   Patients discharged the day of surgery will not be allowed to drive  home.    Special Instructions: Shower using CHG 2 nights before surgery and the night before surgery.  If you shower the day of surgery use CHG.  Use special wash - you have one bottle of CHG for all showers.  You should use approximately 1/3 of the bottle for each shower.   Please read over the following fact sheets that you were given: Pain Booklet, Coughing and Deep Breathing, MRSA Information and Surgical Site Infection Prevention

## 2012-11-19 ENCOUNTER — Telehealth (INDEPENDENT_AMBULATORY_CARE_PROVIDER_SITE_OTHER): Payer: Self-pay

## 2012-11-19 NOTE — Telephone Encounter (Signed)
Pts daughter Steward Drone called requesting PET result from Tuesday. Please call [phone] with result.

## 2012-11-21 MED ORDER — HEPARIN SODIUM (PORCINE) 5000 UNIT/ML IJ SOLN
5000.0000 [IU] | Freq: Once | INTRAMUSCULAR | Status: AC
  Administered 2012-11-22: 5000 [IU] via SUBCUTANEOUS
  Filled 2012-11-21: qty 1

## 2012-11-21 MED ORDER — CEFAZOLIN SODIUM-DEXTROSE 2-3 GM-% IV SOLR
2.0000 g | INTRAVENOUS | Status: AC
  Administered 2012-11-22: 2 g via INTRAVENOUS
  Filled 2012-11-21: qty 50

## 2012-11-22 ENCOUNTER — Ambulatory Visit: Payer: Medicare Other | Admitting: Oncology

## 2012-11-22 ENCOUNTER — Ambulatory Visit: Payer: Medicare Other

## 2012-11-22 ENCOUNTER — Encounter (HOSPITAL_COMMUNITY): Payer: Self-pay | Admitting: Certified Registered Nurse Anesthetist

## 2012-11-22 ENCOUNTER — Ambulatory Visit (HOSPITAL_COMMUNITY): Payer: Medicare PPO | Admitting: Certified Registered Nurse Anesthetist

## 2012-11-22 ENCOUNTER — Other Ambulatory Visit: Payer: Medicare Other | Admitting: Lab

## 2012-11-22 ENCOUNTER — Encounter (HOSPITAL_COMMUNITY)
Admission: RE | Disposition: A | Discharge status: Home / Self Care | Payer: Self-pay | Source: Ambulatory Visit | Attending: General Surgery

## 2012-11-22 ENCOUNTER — Encounter (HOSPITAL_COMMUNITY): Payer: Self-pay | Admitting: *Deleted

## 2012-11-22 ENCOUNTER — Ambulatory Visit (HOSPITAL_COMMUNITY)
Admission: RE | Admit: 2012-11-22 | Discharge: 2012-11-23 | Disposition: A | Discharge status: Home / Self Care | Payer: Medicare PPO | Source: Ambulatory Visit | Attending: General Surgery | Admitting: General Surgery

## 2012-11-22 DIAGNOSIS — Z01818 Encounter for other preprocedural examination: Secondary | ICD-10-CM | POA: Insufficient documentation

## 2012-11-22 DIAGNOSIS — C50419 Malignant neoplasm of upper-outer quadrant of unspecified female breast: Secondary | ICD-10-CM | POA: Insufficient documentation

## 2012-11-22 DIAGNOSIS — C50911 Malignant neoplasm of unspecified site of right female breast: Secondary | ICD-10-CM

## 2012-11-22 DIAGNOSIS — Z01812 Encounter for preprocedural laboratory examination: Secondary | ICD-10-CM | POA: Insufficient documentation

## 2012-11-22 DIAGNOSIS — C50919 Malignant neoplasm of unspecified site of unspecified female breast: Secondary | ICD-10-CM

## 2012-11-22 DIAGNOSIS — Z01810 Encounter for preprocedural cardiovascular examination: Secondary | ICD-10-CM | POA: Insufficient documentation

## 2012-11-22 HISTORY — PX: BREAST LUMPECTOMY WITH NEEDLE LOCALIZATION AND AXILLARY LYMPH NODE DISSECTION: SHX5758

## 2012-11-22 HISTORY — DX: Shortness of breath: R06.02

## 2012-11-22 LAB — GLUCOSE, CAPILLARY: Glucose-Capillary: 104 mg/dL — ABNORMAL HIGH (ref 70–99)

## 2012-11-22 SURGERY — BREAST LUMPECTOMY WITH NEEDLE LOCALIZATION AND AXILLARY LYMPH NODE DISSECTION
Anesthesia: General | Site: Breast | Laterality: Right | Wound class: Clean

## 2012-11-22 MED ORDER — LIDOCAINE-EPINEPHRINE (PF) 1 %-1:200000 IJ SOLN
INTRAMUSCULAR | Status: DC | PRN
  Administered 2012-11-22: 17:00:00 via INTRAMUSCULAR

## 2012-11-22 MED ORDER — LIDOCAINE-EPINEPHRINE (PF) 1 %-1:200000 IJ SOLN
INTRAMUSCULAR | Status: AC
  Filled 2012-11-22: qty 10

## 2012-11-22 MED ORDER — BUPIVACAINE HCL (PF) 0.25 % IJ SOLN
INTRAMUSCULAR | Status: AC
  Filled 2012-11-22: qty 30

## 2012-11-22 MED ORDER — ALBUTEROL SULFATE HFA 108 (90 BASE) MCG/ACT IN AERS
2.0000 | INHALATION_SPRAY | RESPIRATORY_TRACT | Status: DC | PRN
  Filled 2012-11-22: qty 6.7

## 2012-11-22 MED ORDER — MORPHINE SULFATE 2 MG/ML IJ SOLN: 2.0000 mg | INTRAMUSCULAR | Status: DC | PRN

## 2012-11-22 MED ORDER — LEVOTHYROXINE SODIUM 150 MCG PO TABS
150.0000 ug | ORAL_TABLET | Freq: Every day | ORAL | Status: DC
  Administered 2012-11-23: 150 ug via ORAL
  Filled 2012-11-22 (×2): qty 1

## 2012-11-22 MED ORDER — HYDROMORPHONE HCL PF 1 MG/ML IJ SOLN
0.2500 mg | INTRAMUSCULAR | Status: DC | PRN
  Administered 2012-11-22: 0.5 mg via INTRAVENOUS

## 2012-11-22 MED ORDER — HYDROMORPHONE HCL PF 1 MG/ML IJ SOLN
INTRAMUSCULAR | Status: AC
  Administered 2012-11-22: 0.5 mg via INTRAVENOUS
  Filled 2012-11-22: qty 1

## 2012-11-22 MED ORDER — MOMETASONE FURO-FORMOTEROL FUM 100-5 MCG/ACT IN AERO
2.0000 | INHALATION_SPRAY | Freq: Two times a day (BID) | RESPIRATORY_TRACT | Status: DC
  Administered 2012-11-22 – 2012-11-23 (×2): 2 via RESPIRATORY_TRACT
  Filled 2012-11-22: qty 8.8

## 2012-11-22 MED ORDER — HYDROCODONE-ACETAMINOPHEN 5-325 MG PO TABS
1.0000 | ORAL_TABLET | ORAL | Status: DC | PRN
  Administered 2012-11-22: 2 via ORAL
  Administered 2012-11-23: 1 via ORAL
  Administered 2012-11-23: 2 via ORAL
  Filled 2012-11-22 (×2): qty 1
  Filled 2012-11-22: qty 2
  Filled 2012-11-22: qty 1

## 2012-11-22 MED ORDER — OXYCODONE HCL 5 MG PO TABS
ORAL_TABLET | ORAL | Status: AC
  Administered 2012-11-22: 5 mg via ORAL
  Filled 2012-11-22: qty 1

## 2012-11-22 MED ORDER — 0.9 % SODIUM CHLORIDE (POUR BTL) OPTIME
TOPICAL | Status: DC | PRN
  Administered 2012-11-22: 1000 mL

## 2012-11-22 MED ORDER — PROMETHAZINE HCL 25 MG/ML IJ SOLN: 6.2500 mg | INTRAMUSCULAR | Status: DC | PRN

## 2012-11-22 MED ORDER — LIDOCAINE HCL (CARDIAC) 20 MG/ML IV SOLN
INTRAVENOUS | Status: DC | PRN
  Administered 2012-11-22: 70 mg via INTRAVENOUS

## 2012-11-22 MED ORDER — CEFAZOLIN SODIUM 1-5 GM-% IV SOLN
1.0000 g | Freq: Three times a day (TID) | INTRAVENOUS | Status: DC
  Administered 2012-11-22 – 2012-11-23 (×2): 1 g via INTRAVENOUS
  Filled 2012-11-22 (×3): qty 50

## 2012-11-22 MED ORDER — PROPRANOLOL HCL 40 MG PO TABS
40.0000 mg | ORAL_TABLET | Freq: Every day | ORAL | Status: DC
  Filled 2012-11-22: qty 1

## 2012-11-22 MED ORDER — FENTANYL CITRATE 0.05 MG/ML IJ SOLN
INTRAMUSCULAR | Status: DC | PRN
  Administered 2012-11-22: 100 ug via INTRAVENOUS
  Administered 2012-11-22 (×2): 25 ug via INTRAVENOUS
  Administered 2012-11-22: 50 ug via INTRAVENOUS

## 2012-11-22 MED ORDER — FUROSEMIDE 80 MG PO TABS
80.0000 mg | ORAL_TABLET | Freq: Every day | ORAL | Status: DC
  Administered 2012-11-23: 80 mg via ORAL
  Filled 2012-11-22: qty 1

## 2012-11-22 MED ORDER — AMLODIPINE BESYLATE 5 MG PO TABS
5.0000 mg | ORAL_TABLET | Freq: Every day | ORAL | Status: DC
  Filled 2012-11-22: qty 1

## 2012-11-22 MED ORDER — DIAZEPAM 2 MG PO TABS: 2.0000 mg | ORAL_TABLET | Freq: Three times a day (TID) | ORAL | Status: DC | PRN

## 2012-11-22 MED ORDER — ONDANSETRON HCL 4 MG/2ML IJ SOLN
INTRAMUSCULAR | Status: DC | PRN
  Administered 2012-11-22: 4 mg via INTRAVENOUS

## 2012-11-22 MED ORDER — KCL IN DEXTROSE-NACL 20-5-0.45 MEQ/L-%-% IV SOLN
INTRAVENOUS | Status: DC
  Administered 2012-11-22: 21:00:00 via INTRAVENOUS
  Filled 2012-11-22 (×3): qty 1000

## 2012-11-22 MED ORDER — ENOXAPARIN SODIUM 30 MG/0.3ML ~~LOC~~ SOLN
40.0000 mg | SUBCUTANEOUS | Status: DC
  Filled 2012-11-22: qty 0.4

## 2012-11-22 MED ORDER — OXYCODONE HCL 5 MG PO TABS
5.0000 mg | ORAL_TABLET | Freq: Once | ORAL | Status: AC | PRN
  Administered 2012-11-22: 5 mg via ORAL

## 2012-11-22 MED ORDER — POTASSIUM CHLORIDE CRYS ER 20 MEQ PO TBCR
20.0000 meq | EXTENDED_RELEASE_TABLET | Freq: Every day | ORAL | Status: DC
  Administered 2012-11-23: 20 meq via ORAL
  Filled 2012-11-22: qty 1

## 2012-11-22 MED ORDER — OXYCODONE HCL 5 MG/5ML PO SOLN: 5.0000 mg | Freq: Once | ORAL | Status: AC | PRN

## 2012-11-22 MED ORDER — LACTATED RINGERS IV SOLN
INTRAVENOUS | Status: DC | PRN
  Administered 2012-11-22 (×2): via INTRAVENOUS

## 2012-11-22 MED ORDER — METHYLENE BLUE 1 % INJ SOLN
INTRAMUSCULAR | Status: AC
  Filled 2012-11-22: qty 10

## 2012-11-22 MED ORDER — SODIUM CHLORIDE 0.9 % IJ SOLN
INTRAMUSCULAR | Status: DC | PRN
  Administered 2012-11-22: 16:00:00 via INTRAMUSCULAR

## 2012-11-22 MED ORDER — LACTATED RINGERS IV SOLN: INTRAVENOUS | Status: DC

## 2012-11-22 MED ORDER — PROPOFOL 10 MG/ML IV BOLUS
INTRAVENOUS | Status: DC | PRN
  Administered 2012-11-22: 120 mg via INTRAVENOUS

## 2012-11-22 SURGICAL SUPPLY — 50 items
ADH SKN CLS APL DERMABOND .7 (GAUZE/BANDAGES/DRESSINGS) ×1
APPLIER CLIP 9.375 SM OPEN (CLIP) ×4
APR CLP SM 9.3 20 MLT OPN (CLIP) ×2
BINDER BREAST LRG (GAUZE/BANDAGES/DRESSINGS) IMPLANT
BINDER BREAST XLRG (GAUZE/BANDAGES/DRESSINGS) IMPLANT
BLADE SURG 10 STRL SS (BLADE) ×2 IMPLANT
BLADE SURG 15 STRL LF DISP TIS (BLADE) ×1 IMPLANT
BLADE SURG 15 STRL SS (BLADE) ×2
CHLORAPREP W/TINT 26ML (MISCELLANEOUS) ×2 IMPLANT
CLIP APPLIE 9.375 SM OPEN (CLIP) IMPLANT
CLOTH BEACON ORANGE TIMEOUT ST (SAFETY) ×2 IMPLANT
CONT SPEC 4OZ CLIKSEAL STRL BL (MISCELLANEOUS) ×4 IMPLANT
COVER SURGICAL LIGHT HANDLE (MISCELLANEOUS) ×2 IMPLANT
DECANTER SPIKE VIAL GLASS SM (MISCELLANEOUS) ×2 IMPLANT
DERMABOND ADVANCED (GAUZE/BANDAGES/DRESSINGS) ×1
DERMABOND ADVANCED .7 DNX12 (GAUZE/BANDAGES/DRESSINGS) ×1 IMPLANT
DEVICE DUBIN SPECIMEN MAMMOGRA (MISCELLANEOUS) ×1 IMPLANT
DRAIN CHANNEL 19F RND (DRAIN) ×2 IMPLANT
DRAPE CHEST BREAST 15X10 FENES (DRAPES) ×2 IMPLANT
DRAPE UTILITY 15X26 W/TAPE STR (DRAPE) ×4 IMPLANT
ELECT CAUTERY BLADE 6.4 (BLADE) ×1 IMPLANT
ELECT COATED BLADE 2.86 ST (ELECTRODE) ×1 IMPLANT
ELECT REM PT RETURN 9FT ADLT (ELECTROSURGICAL) ×2
ELECTRODE REM PT RTRN 9FT ADLT (ELECTROSURGICAL) ×1 IMPLANT
EVACUATOR SILICONE 100CC (DRAIN) ×2 IMPLANT
GLOVE EUDERMIC 7 POWDERFREE (GLOVE) ×2 IMPLANT
GLOVE SURG SS PI 6.5 STRL IVOR (GLOVE) ×3 IMPLANT
GOWN STRL NON-REIN LRG LVL3 (GOWN DISPOSABLE) ×4 IMPLANT
GOWN STRL REIN XL XLG (GOWN DISPOSABLE) ×2 IMPLANT
KIT BASIN OR (CUSTOM PROCEDURE TRAY) ×2 IMPLANT
KIT MARKER MARGIN INK (KITS) IMPLANT
KIT ROOM TURNOVER OR (KITS) ×2 IMPLANT
NDL HYPO 25GX1X1/2 BEV (NEEDLE) ×1 IMPLANT
NEEDLE HYPO 25GX1X1/2 BEV (NEEDLE) ×2 IMPLANT
NS IRRIG 1000ML POUR BTL (IV SOLUTION) ×2 IMPLANT
PACK SURGICAL SETUP 50X90 (CUSTOM PROCEDURE TRAY) ×2 IMPLANT
PAD ARMBOARD 7.5X6 YLW CONV (MISCELLANEOUS) ×2 IMPLANT
PENCIL BUTTON HOLSTER BLD 10FT (ELECTRODE) ×2 IMPLANT
SPONGE GAUZE 4X4 12PLY (GAUZE/BANDAGES/DRESSINGS) ×1 IMPLANT
SPONGE LAP 18X18 X RAY DECT (DISPOSABLE) ×2 IMPLANT
SPONGE LAP 4X18 X RAY DECT (DISPOSABLE) ×3 IMPLANT
SUT ETHILON 2 0 FS 18 (SUTURE) ×2 IMPLANT
SUT MNCRL AB 4-0 PS2 18 (SUTURE) ×2 IMPLANT
SUT VIC AB 3-0 SH 18 (SUTURE) ×2 IMPLANT
SYR CONTROL 10ML LL (SYRINGE) ×2 IMPLANT
TAPE CLOTH SURG 4X10 WHT LF (GAUZE/BANDAGES/DRESSINGS) ×1 IMPLANT
TOWEL OR 17X24 6PK STRL BLUE (TOWEL DISPOSABLE) ×2 IMPLANT
TOWEL OR 17X26 10 PK STRL BLUE (TOWEL DISPOSABLE) ×2 IMPLANT
TUBE CONNECTING 12X1/4 (SUCTIONS) ×2 IMPLANT
YANKAUER SUCT BULB TIP NO VENT (SUCTIONS) ×2 IMPLANT

## 2012-11-22 NOTE — Anesthesia Preprocedure Evaluation (Signed)
Anesthesia Evaluation    Reviewed: Allergy & Precautions, H&P , NPO status , Patient's Chart, lab work & pertinent test results  Airway       Dental   Pulmonary asthma ,          Cardiovascular hypertension, Pt. on home beta blockers     Neuro/Psych negative neurological ROS     GI/Hepatic Neg liver ROS, GERD-  ,  Endo/Other  diabetesHypothyroidism   Renal/GU negative Renal ROS     Musculoskeletal   Abdominal   Peds  Hematology   Anesthesia Other Findings   Reproductive/Obstetrics                           Anesthesia Physical Anesthesia Plan  ASA: III  Anesthesia Plan: General   Post-op Pain Management:    Induction: Intravenous  Airway Management Planned: Oral ETT  Additional Equipment:   Intra-op Plan:   Post-operative Plan: Extubation in OR  Informed Consent:   Plan Discussed with:   Anesthesia Plan Comments:         Anesthesia Titzer Evaluation

## 2012-11-22 NOTE — Brief Op Note (Signed)
11/22/2012  5:40 PM  PATIENT:  Amber [name]  79 y.o. female  PRE-OPERATIVE DIAGNOSIS:  right breast cancer   POST-OPERATIVE DIAGNOSIS:  same  PROCEDURE:  Procedure(s) (LRB) with comments: BREAST LUMPECTOMY WITH NEDLE LOCALIZATION AND AXILLARY LYMPH NODE DISSECTION (Right)  SURGEON:  Surgeon(s) and Role:    * Lodema Pilot, DO - Primary  PHYSICIAN ASSISTANT:   ASSISTANTS: none   ANESTHESIA:   general  EBL:  Total I/O In: 1000 [I.V.:1000] Out: 45 [Blood:45]  BLOOD ADMINISTERED:none  DRAINS: (34F) Jackson-Pratt drain(s) with closed bulb suction in the axilla   LOCAL MEDICATIONS USED:  MARCAINE    and LIDOCAINE   SPECIMEN:  Source of Specimen:  right axillary nodes, right lumpectomy  DISPOSITION OF SPECIMEN:  PATHOLOGY  COUNTS:  YES  TOURNIQUET:  * No tourniquets in log *  DICTATION: .Other Dictation: Dictation Number   PLAN OF CARE: Admit to inpatient   PATIENT DISPOSITION:  PACU - hemodynamically stable.   Delay start of Pharmacological VTE agent (>24hrs) due to surgical blood loss or risk of bleeding: no

## 2012-11-22 NOTE — Anesthesia Postprocedure Evaluation (Signed)
Anesthesia Post Note  Patient: Amber [name]  Procedure(s) Performed: Procedure(s) (LRB): BREAST LUMPECTOMY WITH NEDLE LOCALIZATION AND AXILLARY LYMPH NODE DISSECTION (Right)  Anesthesia type: general  Patient location: PACU  Post pain: Pain level controlled  Post assessment: Patient's Cardiovascular Status Stable  Last Vitals:  Filed Vitals:   11/22/12 1730  BP:   Pulse: 75  Temp:   Resp: 14    Post vital signs: Reviewed and stable  Level of consciousness: sedated  Complications: No apparent anesthesia complications

## 2012-11-22 NOTE — Preoperative (Signed)
Beta Blockers   Reason not to administer Beta Blockers:Not Applicable, propanolol taken today

## 2012-11-22 NOTE — Transfer of Care (Signed)
Immediate Anesthesia Transfer of Care Note  Patient: Amber [name]  Procedure(s) Performed: Procedure(s) (LRB) with comments: BREAST LUMPECTOMY WITH NEDLE LOCALIZATION AND AXILLARY LYMPH NODE DISSECTION (Right)  Patient Location: PACU  Anesthesia Type:General  Level of Consciousness: awake, alert , oriented and patient cooperative  Airway & Oxygen Therapy: Patient Spontanous Breathing and Patient connected to face mask oxygen  Post-op Assessment: Report given to PACU RN, Post -op Vital signs reviewed and stable and Patient moving all extremities  Post vital signs: Reviewed and stable  Complications: No apparent anesthesia complications

## 2012-11-22 NOTE — H&P (View-Only) (Signed)
I discussed the MRI findings with the patient's daughter and we again discussed the options.  We have discussed her care at breast conference this am and we have decided to proceed with right lumpectomy with excision of the positive node and limited axillary dissection.  She will then receive radiation to breast and axilla.  We did not feel that given her age and DM that full axillary dissection would confer much benefit and risk lymphedema or nerve injury.  She agreed with this plan.  I have also discussed this with Dr. Cornella who agrees with the plan and will localize the axillary lymph node for excision.  

## 2012-11-22 NOTE — Interval H&P Note (Signed)
History and Physical Interval Note:  11/22/2012 2:48 PM  Amber [name]  has presented today for surgery, with the diagnosis of right breast cancer   The various methods of treatment have been discussed with the patient and family. After consideration of risks, benefits and other options for treatment, the patient has consented to  Procedure(s) (LRB) with comments: BREAST LUMPECTOMY WITH NEDLE LOCALIZATION AND AXILLARY LYMPH NODE DISSECTION (Right) as a surgical intervention .  The patient's history has been reviewed, patient examined, no change in status, stable for surgery.  I have reviewed the patient's chart and labs.  Questions were answered to the patient's satisfaction.  The patient was seen and examined in the preop area with her daughter and nurse.  Site was marked and the planned procedure again discussed with the patient and the daughter.  I again explained that the standard treatment would be full axillary dissection and the pros and cons of this versus a more limited dissection but given her age we decided that a more limited dissection would be prudent.  We will do the lumpectomy and remove the known malignant node as well as any other blue nodes or palpable disease.  She will likely receive radiation as well.  The other risks of breast surgery were discussed.  The risks of infection, bleeding, pain, scarring, poor cosmesis, recurrence, need for re-excision or future surgery, nerve injury and lymphedema again discussed with the patient.  She desires to proceed with right needle loc lumpectomy and possible right axillary dissection, removal of right axillary lymph node.    Lodema Pilot DAVID

## 2012-11-22 NOTE — Anesthesia Procedure Notes (Signed)
Procedure Name: LMA Insertion Date/Time: 11/22/2012 3:03 PM Performed by: Angelica Pou Pre-anesthesia Checklist: Patient identified, Timeout performed, Emergency Drugs available, Suction available and Patient being monitored Patient Re-evaluated:Patient Re-evaluated prior to inductionOxygen Delivery Method: Circle system utilized Preoxygenation: Pre-oxygenation with 100% oxygen Intubation Type: IV induction LMA: LMA inserted LMA Size: 4.0 Number of attempts: 1 Placement Confirmation: breath sounds checked- equal and bilateral and positive ETCO2 Tube secured with: Tape Dental Injury: Teeth and Oropharynx as per pre-operative assessment

## 2012-11-23 ENCOUNTER — Telehealth (INDEPENDENT_AMBULATORY_CARE_PROVIDER_SITE_OTHER): Payer: Self-pay | Admitting: General Surgery

## 2012-11-23 LAB — CBC
HCT: 37.9 % (ref 36.0–46.0)
Hemoglobin: 12.6 g/dL (ref 12.0–15.0)
MCHC: 33.2 g/dL (ref 30.0–36.0)
RBC: 4.14 MIL/uL (ref 3.87–5.11)
WBC: 5.2 10*3/uL (ref 4.0–10.5)

## 2012-11-23 LAB — BASIC METABOLIC PANEL
BUN: 15 mg/dL (ref 6–23)
Chloride: 102 mEq/L (ref 96–112)
GFR calc Af Amer: 56 mL/min — ABNORMAL LOW (ref >90)
GFR calc non Af Amer: 49 mL/min — ABNORMAL LOW (ref >90)
Potassium: 3.5 mEq/L (ref 3.5–5.1)
Sodium: 139 mEq/L (ref 135–145)

## 2012-11-23 LAB — GLUCOSE, CAPILLARY: Glucose-Capillary: 92 mg/dL (ref 70–99)

## 2012-11-23 MED ORDER — HYDROCODONE-ACETAMINOPHEN 5-325 MG PO TABS: 1.0000 | ORAL_TABLET | ORAL | Status: DC | PRN

## 2012-11-23 NOTE — Discharge Summary (Signed)
Physician Discharge Summary  Patient ID: Amber [name] MRN: [id number] DOB/AGE: 11-20-1925 79 y.o.  Admit date: 11/22/2012 Discharge date: 11/23/2012  Admission Diagnoses: breast cancer  Discharge Diagnoses: same Active Problems:  * No active hospital problems. *    Discharged Condition: stable  Hospital Course: to OR 11/22/12 for right needle loc lupectomy with axillary dissection.  Tolerated the procedure well and no apparent complications.  She was kept overnight for drain teaching and observation given her advanced age.  She was doing well on pod 1 and ready for discharge on pod 1  Consults: None  Significant Diagnostic Studies: none  Treatments: surgery: 11/22/12  Disposition:   Discharge Orders    Future Appointments: Provider: Department: Dept Phone: Center:   12/06/2012 2:30 PM [facility] [phone] None   12/06/2012 2:45 PM Krista Blue [facility] MEDICAL ONCOLOGY [phone] None   12/06/2012 3:00 PM Victorino December, MD [facility] MEDICAL ONCOLOGY [phone] None   12/09/2012 1:15 PM Lodema Pilot, DO [facility], [place] [phone] None     Future Orders Please Complete By Expires   Diet - low sodium heart healthy      Increase activity slowly      Discharge instructions      Comments:   Strip and record drain output twice daily and as needed.  Record the output for each 24 hour period.  Call the office [phone] when drain output is less than 87ml/day for two days in a row. Call [phone] for follow up appointment in about 10 days. Sponge bathe around the drain.   Call MD for:  temperature >100.4      Call MD for:  severe uncontrolled pain      Call MD for:  redness, tenderness, or signs of infection (pain, swelling, redness, odor or green/yellow discharge around incision site)          Medication List     As of 11/23/2012 10:02 AM    TAKE these  medications         ADVAIR DISKUS 250-50 MCG/DOSE Aepb   Generic drug: Fluticasone-Salmeterol   as needed.      amLODipine 5 MG tablet   Commonly known as: NORVASC   Daily.      diazepam 5 MG tablet   Commonly known as: VALIUM   Three times daily as needed.      furosemide 80 MG tablet   Commonly known as: LASIX   Daily.      HYDROcodone-acetaminophen 5-325 MG per tablet   Commonly known as: NORCO/VICODIN   Take 1 tablet by mouth every 4 (four) hours as needed.      potassium chloride SA 20 MEQ tablet   Commonly known as: K-DUR,KLOR-CON   Daily.      pravastatin 40 MG tablet   Commonly known as: PRAVACHOL   Daily.      PROAIR HFA 108 (90 BASE) MCG/ACT inhaler   Generic drug: albuterol   as needed.      promethazine 25 MG tablet   Commonly known as: PHENERGAN   Take 25 mg by mouth every 12 (twelve) hours as needed. Take 1/2 or 1 whole table per dosage as needed.      propranolol 40 MG tablet   Commonly known as: INDERAL   Daily.      SYNTHROID 150 MCG tablet   Generic drug: levothyroxine   Daily.  traMADol 50 MG tablet   Commonly known as: ULTRAM   Every 6 hours as needed.         SignedLodema Pilot DAVID 11/23/2012, 10:02 AM

## 2012-11-23 NOTE — Progress Notes (Signed)
Brief Nutrition Note:  RD pulled to pt for Malnutrition Screening Tool: pt indicated unintentional weight loss PTA.  Pt reports previously was not eating well and had lost some weight. Now appetite is improved and has gained the weight back. Ate 100% of breakfast this morning.   Body mass index is 38.15 kg/(m^2). Obesity class 2, some weight loss would be beneficial for this pt.  Diet: Regular  No nutrition interventions warranted at this time. Encouraged pt to follow up with RD at cancer center if appetite or weight begins to decrease in the future.   Clarene Duke RD, LDN Pager [phone] After Hours pager [phone]

## 2012-11-23 NOTE — Progress Notes (Signed)
1 Day Post-Op  Subjective: Feels fine.  Minimal discomfort.  Objective: Vital signs in last 24 hours: Temp:  [96.5 F (35.8 C)-99.1 F (37.3 C)] 99.1 F (37.3 C) (01/21 0503) Pulse Rate:  [71-93] 79  (01/21 0503) Resp:  [12-20] 20  (01/21 0503) BP: (97-139)/(44-78) 115/52 mmHg (01/21 0503) SpO2:  [95 %-100 %] 96 % (01/21 0503) Weight:  [208 lb 8.9 oz (94.6 kg)] 208 lb 8.9 oz (94.6 kg) (01/21 0503) Last BM Date: 11/21/12  Intake/Output from previous day: 01/20 0701 - 01/21 0700 In: 1750 [I.V.:1700; IV Piggyback:50] Out: 80 [Drains:35; Blood:45] Intake/Output this shift:    General appearance: alert, cooperative and no distress Resp: nonlabored Breasts: right breast incisions with some slight bruising but no significant tenderness, no sign of infection.  JP thin ss output Cardio: normal rate  Lab Results:   Basename 11/23/12 0655  WBC 5.2  HGB 12.6  HCT 37.9  PLT 145*   BMET No results found for this basename: NA:2,K:2,CL:2,CO2:2,GLUCOSE:2,BUN:2,CREATININE:2,CALCIUM:2 in the last 72 hours PT/INR No results found for this basename: LABPROT:2,INR:2 in the last 72 hours ABG No results found for this basename: PHART:2,PCO2:2,PO2:2,HCO3:2 in the last 72 hours  Studies/Results: No results found.  Anti-infectives: Anti-infectives     Start     Dose/Rate Route Frequency Ordered Stop   11/22/12 2200   ceFAZolin (ANCEF) IVPB 1 g/50 mL premix        1 g 100 mL/hr over 30 Minutes Intravenous 3 times per day 11/22/12 1921 11/23/12 2159   11/22/12 0600   ceFAZolin (ANCEF) IVPB 2 g/50 mL premix        2 g 100 mL/hr over 30 Minutes Intravenous On call to O.R. 11/21/12 1444 11/22/12 1515          Assessment/Plan: s/p Procedure(s) (LRB) with comments: BREAST LUMPECTOMY WITH NEDLE LOCALIZATION AND AXILLARY LYMPH NODE DISSECTION (Right) she actually looks very good.  wound okay.  will do some drain teaching today and she should be okay for discharge to home later today.  LOS: 1 day    Lodema Pilot DAVID 11/23/2012

## 2012-11-23 NOTE — Telephone Encounter (Signed)
Spoke with patient daughter Cameron Sprang appt was made for post op visit Feb 6@1 :15 PM

## 2012-11-23 NOTE — Op Note (Signed)
[name], [name] NO.:  [id number]  MEDICAL RECORD NO.:  [id number]  LOCATION:  6N01C                        FACILITY:  [facility]  PHYSICIAN:  Lodema Pilot, MD       DATE OF BIRTH:  [birth year]  DATE OF PROCEDURE:  11/22/2012 DATE OF DISCHARGE:                              OPERATIVE REPORT   PROCEDURE:  Right needle localized lumpectomy with right axillary dissection.  PREOPERATIVE DIAGNOSIS:  Right breast cancer.  POSTOPERATIVE DIAGNOSIS:  Right breast cancer.  SURGEON:  Lodema Pilot, MD  ASSISTANT:  None.  ANESTHESIA:  General LMA anesthesia with 50 mL of 1% lidocaine with epinephrine and 0.25% Marcaine in a 50:50 mixture.  FLUIDS:  One liter of crystalloid.  ESTIMATED BLOOD LOSS:  50 mL.  DRAINS:  A 19-French Blake drain placed in the right axilla.  SPECIMEN: 1. Right axillary lymph nodes. 2. Localize right axillary lymph nodes. 3. Additional axillary lymph node. 4. Right breast lumpectomy with a short suture marking in the superior     margin and a long suture marking in the lateral margin and special     morphology of additional deep and medial margins.  COMPLICATIONS:  None apparent.  FINDINGS:  Successful excision of the right breast tumor in the prior biopsy clip confirmed by Dr. Yolanda [name] with imaging, also with retrieval of pathologically enlarged right axillary lymph nodes as well as several other palpable right axillary lymph nodes.  The short suture marking in the superior margin of the lumpectomy specimen, long suture marking in the lateral margin of the lumpectomy specimen, very shallow superficial margins for the lumpectomy to the lumpectomy cavity, I am not sure much that additional tissue would able to be taken superficially immediately posterior as well.  The pectoralis muscle was identified and the fascia was taken off the pectoralis with the additional resection of the additional deep margin.  INDICATIONS FOR PROCEDURE:  Ms.  Amber [name] is an 79 year old female with biopsy-proven right breast cancer as well as pathologically enlarged lymph nodes on ultrasound and that she had biopsy of this lymph node, which was positive for metastatic carcinoma as well.  I discussed her case with the patient and her family as well as at our breast conference and we felt that limited axillary dissection with localization of the pathologic lymph node would be appropriate to ensure excision of this as well as lumpectomy followed by radiation therapy.  OPERATIVE DETAILS:  Ms. Amber [name] was seen and evaluated in the preoperative area with one of her daughters present and the surgical site was marked prior to anesthetic administration.  She had already had localization of her right breast mass as well as the pathologic lymph nodes in the axilla and I again discussed with her the pros and cons of this approach and she expressed understanding and agreed with surgical plan.  The prophylactic antibiotics were given and subcu heparin was administrated and she was taken to the operating room, placed on the table in supine position.  General LMA anesthesia was obtained.  A surgical time-out was performed to confirm proper patient and procedure, and laterality and then her right breast was prepped and injected four-circumareolar injection of 5 mL of  methylene blue.  Vigorous breast massage was performed and then her breast, arm and axilla were prepped and draped in the standard surgical fashion.  I started in the axilla.  Made an incision inferior to the hair-bearing area of the axilla near the previously placed guidewire and needle localizing wire.  I carried the dissection down into the clavipectoral fascia using Bovie electrocautery.  The axillary contents were entered and I followed the guidewire down into the axilla, it went through a large palpable nodes, fairly superficial just deep to the clavipectoral fascia.  This was palpable and  obviously pathologic size and I excised this lymph node. This was sent as lymph node #1.  I followed the guidewire down to the tip and near the tip height in the axilla medial towards the chest wall. She had another palpable change of lymph node and I removed the lymph node at the tip of the wire and this was sent as specimen #2 as a localized lymph nodes.  She had some other palpable lymph nodes in the axilla and I proceed to clean out any other palpable lymph nodes and I did clean out any other palpable lymph nodes and several other lymph nodes and lymphatic tissue was removed.  The axillary vein was identified and so was the long thoracic nerve and dissection was remained away from these nerves.  I did not identify the long thoracic nerve and tried to stay away from the chest wall with the dissection.  I removed several lymph nodes from the axilla and sent these all as a granulation of tissue as specimen #3 has additional axillary lymph nodes.  I irrigated the axilla and hemostasis was obtained with Bovie electrocautery.  After the wound was in the axilla, I did use several hemoclips on any lymphovascular-appearing structure again taking care to avoid injury to any nerve structures.  I stopped the axilla with gauze and then proceeded to perform the right needle localized lumpectomy.  It made a curvilinear incision in the area of the localizing wire and carried the dissection down into the breast tissue.  Breast flaps were elevated circumferentially and then dissection was carried posterior towards the chest wall using Bovie electrocautery.  I tried to dissect widely around the wire and I carried the dissection towards the chest wall.  After I had dissected circumferentially down towards the chest wall, a short stitch was placed on the superior margin, a long stitch was placed on the lateral margin and then the specimen was undermined and removed.  She had a palpable mass within the  center of the lesion and we x-rayed this and the clip and mass were noted to be in the center of the specimen and this was confirmed by Dr. Yolanda [name] as well.  It took some additional posterior margin as well and I thought as if any of the margins would be close that it would be the posterior and medial margin, which might be possibly the closest, so additional tissue was taken in this area and sent as additional posterior and medial margins.  I did dissection on the chest wall immediately in the center of the dissection.  Some additional posteromedial tissue and posterosuperior tissue may be able to be removed.  Superficial flaps would be difficult to remove any additional tissue.  Hemostasis was obtained and the wound with Bovie electrocautery.  Hemoclips were placed at the 12 o'clock, 3 o'clock, 6 o'clock, and 9 o'clock positions in the wounds.  The wound was irrigated and  again noted to be hemostatic and then the deep dermis and subcu tissue were approximated with several interrupted 3-0 Vicryl sutures for attempting for a watertight closure prior to securing the final two sutures.  I infiltrated the cavity with 40 mL of 1% lidocaine with epinephrine and 0.25% Marcaine in a 50:50 mixture and secured the final sutures for a watertight closure.  The skin edges were approximated with 4-0 Monocryl subcuticular suture in the axilla.  I placed a 19-French Blake drains through separate stab incision and the wound was again noted to be hemostatic.  The clavipectoral fascia was then approximated with 3-0 Vicryl sutures and the skin edges were approximated with 4-0 Monocryl subcuticular suture. Skin was washed and dried and Dermabond was applied.  The drain was sutured in place with 3-0 nylon and drain stitch.  All sponge, needle, and instrument counts were correct at the end of the case.  The patient tolerated the procedure well without apparent complication.           ______________________________ Lodema Pilot, MD     BL/MEDQ  D:  11/22/2012  T:  11/23/2012  Job:  [id number]

## 2012-11-24 ENCOUNTER — Encounter (HOSPITAL_COMMUNITY): Payer: Self-pay | Admitting: General Surgery

## 2012-11-29 ENCOUNTER — Ambulatory Visit (INDEPENDENT_AMBULATORY_CARE_PROVIDER_SITE_OTHER): Payer: Medicare PPO | Admitting: General Surgery

## 2012-11-29 VITALS — BP 128/70 | HR 74 | Temp 98.2°F | Resp 18 | Ht 62.0 in | Wt 208.0 lb

## 2012-11-29 DIAGNOSIS — Z4889 Encounter for other specified surgical aftercare: Secondary | ICD-10-CM

## 2012-11-29 DIAGNOSIS — Z4803 Encounter for change or removal of drains: Secondary | ICD-10-CM

## 2012-11-29 NOTE — Patient Instructions (Signed)
Your drain site does not show any signs of infection and is healing well. Your drain only had 1 cc in it and thus the removal of it today. Please continue to change the dressing on this site daily until it has completely closed. Do not apply any soaps or lotions to this site in order to reduce irritation. Please make sure to keep your appointment to see Dr. Biagio Quint on 12/09/12. If your site starts to drain an excessive amount prior to that visit please call our office.

## 2012-11-29 NOTE — Progress Notes (Signed)
Patient presented to clinic for drain check, post op lumpectomy by Dr. Biagio Quint. Patient did not have any signs of infection, foul odor or discharge from the drain site. Patient only had 1 cc in bulb at time of visit. Her drainage list shows patient has had 1 cc per day since 11/26/12 and 4 cc on 11/25/12. Patient advised to keep her post op appointment on 12/09/12 and to discuss exercises she can do in order to improve mobility in arm post op. Patient advised to make sure to change the bandage on drain site until the site has completely closed. Patient agreed.

## 2012-12-06 ENCOUNTER — Telehealth: Payer: Self-pay | Admitting: Oncology

## 2012-12-06 ENCOUNTER — Ambulatory Visit: Payer: Medicare PPO

## 2012-12-06 ENCOUNTER — Encounter: Payer: Self-pay | Admitting: Oncology

## 2012-12-06 ENCOUNTER — Other Ambulatory Visit (HOSPITAL_BASED_OUTPATIENT_CLINIC_OR_DEPARTMENT_OTHER): Payer: Medicare PPO | Admitting: Lab

## 2012-12-06 ENCOUNTER — Ambulatory Visit (HOSPITAL_BASED_OUTPATIENT_CLINIC_OR_DEPARTMENT_OTHER): Payer: Medicare PPO | Admitting: Oncology

## 2012-12-06 VITALS — BP 130/79 | HR 68 | Temp 98.9°F | Resp 18 | Ht 62.0 in | Wt 208.3 lb

## 2012-12-06 DIAGNOSIS — C50419 Malignant neoplasm of upper-outer quadrant of unspecified female breast: Secondary | ICD-10-CM

## 2012-12-06 DIAGNOSIS — C773 Secondary and unspecified malignant neoplasm of axilla and upper limb lymph nodes: Secondary | ICD-10-CM

## 2012-12-06 DIAGNOSIS — Z170 Estrogen receptor positive status [ER+]: Secondary | ICD-10-CM

## 2012-12-06 LAB — COMPREHENSIVE METABOLIC PANEL (CC13)
ALT: 11 U/L (ref 0–55)
BUN: 18.9 mg/dL (ref 7.0–26.0)
CO2: 30 mEq/L — ABNORMAL HIGH (ref 22–29)
Calcium: 9.3 mg/dL (ref 8.4–10.4)
Creatinine: 1.1 mg/dL (ref 0.6–1.1)
Total Bilirubin: 0.33 mg/dL (ref 0.20–1.20)

## 2012-12-06 LAB — CBC WITH DIFFERENTIAL/PLATELET
BASO%: 0.7 % (ref 0.0–2.0)
Basophils Absolute: 0 10*3/uL (ref 0.0–0.1)
EOS%: 1.5 % (ref 0.0–7.0)
HCT: 39.5 % (ref 34.8–46.6)
HGB: 13.1 g/dL (ref 11.6–15.9)
LYMPH%: 33.4 % (ref 14.0–49.7)
MCH: 30.4 pg (ref 25.1–34.0)
MCHC: 33.3 g/dL (ref 31.5–36.0)
MONO#: 0.4 10*3/uL (ref 0.1–0.9)
NEUT%: 57.8 % (ref 38.4–76.8)
Platelets: 184 10*3/uL (ref 145–400)

## 2012-12-06 NOTE — Telephone Encounter (Signed)
gv pt dtr appt schedule for April and appt w/Dr. Roselind Messier for 2/6 @ 3:30pm (s/w Bjorn Loser)

## 2012-12-06 NOTE — Progress Notes (Signed)
Amber [name] [id number] 03-13-1926 79 y.o. 12/06/2012 3:48 PM  CC  [name], MD [address] [place] [address] Dr. Lodema Pilot Dr. Antony Blackbird  REASON FOR CONSULTATION:  79 year old female with new diagnosis of invasive ductal carcinoma stage III of the right breast diagnosed December 2013.patient is seen in medical oncology for discussion of treatment for adjuvant therapy  STAGE:  Right breast screening 12/20013 S/p Right lumpectomy with SNL 2.7 cm IDC, ER7%/PR-/Her2Neu+ (2.83),  Ki-67 19%% 8/11 Lymph nodes positive for metastatic disease  REFERRING PHYSICIAN: Dr. Lodema Pilot  HISTORY OF PRESENT ILLNESS:  Amber [name] is a 79 y.o. female who is accompanied by her daughters. Patient has multiple medical problems including arthritis asthma cancer diabetes gastroesophageal reflux disease neuromuscular disorder. Recently she had a routine screening mammogram performed in December 2013. This showed an abnormality in the upper outer quadrant of the right breast. There was also noted to be suspicious lymph nodes in the right axilla. She underwent biopsies of the both areas revealing invasive ductal carcinoma the right breast with metastatic carcinoma in the right axillary lymph node. HER-2/neu was amplified at 2.83. Tumor was partially staining at 7% for estrogen receptor and negative for progesterone receptor. MRI of the breast area revealed 2.7 x 2.4 x 2.2 cm lesion deep within the upper outer quadrant of the right breast. There were also noted to be suspicious level I right axillary lymph nodes. PET scan performed and revealed malignant uptake in the right breast lesion as well as hypermetabolic activity in the right axillary and right retropectoral lymph node areas. Patient subsequently went to partial mastectomy with limited axillary dissection with plans for postoperative radiation therapy. On her final pathology she was found to have a 2.7 cm  invasive moderately differentiated mammary carcinoma with mixed ductal and lobular phenotype. Lymphovascular space invasion was noted. 8 of 11 lymph nodes were positive for metastatic disease.patient is seen in medical oncology for discussion of treatment options.   Past Medical History: Past Medical History  Diagnosis Date  . Arthritis   . Asthma   . Cancer     breast  . Diabetes mellitus without complication   . GERD (gastroesophageal reflux disease)   . Hyperlipidemia   . Hypertension   . Neuromuscular disorder   . Thyroid disease   . Hearing loss   . Visual disturbance   . Hypothyroidism   . Shortness of breath     occasionally with "excitement"    Past Surgical History: Past Surgical History  Procedure Date  . Back surgery 1980 - approximate  . Carpal tunnel release unsure of date    both hands  . Rotator cuff repair unsure of date    not sure which shoulder  . Elbow surgery unsure of date  . Cervical spine surgery unsure of date  . Abdominal hysterectomy   . Breast lumpectomy with nedle localization and axillary lymph node dissection 11/22/2012    Procedure: BREAST LUMPECTOMY WITH NEDLE LOCALIZATION AND AXILLARY LYMPH NODE DISSECTION;  Surgeon: Lodema Pilot, DO;  Location: [facility] OR;  Service: General;  Laterality: Right;    Family History: Family History  Problem Relation Age of Onset  . Cancer Mother     uterine  . Cancer Brother     oral, throat    Social History History  Substance Use Topics  . Smoking status: Never Smoker   . Smokeless tobacco: Never Used  . Alcohol Use: No    Allergies: No Known  Allergies  Current Medications: Current Outpatient Prescriptions  Medication Sig Dispense Refill  . ADVAIR DISKUS 250-50 MCG/DOSE AEPB as needed.      Marland Kitchen amLODipine (NORVASC) 5 MG tablet Daily.      . diazepam (VALIUM) 5 MG tablet Three times daily as needed.      . furosemide (LASIX) 80 MG tablet Daily.      Marland Kitchen HYDROcodone-acetaminophen (NORCO/VICODIN)  5-325 MG per tablet Take 1 tablet by mouth every 4 (four) hours as needed.  40 tablet  0  . potassium chloride SA (K-DUR,KLOR-CON) 20 MEQ tablet Daily.      . pravastatin (PRAVACHOL) 40 MG tablet Daily.      Marland Kitchen PROAIR HFA 108 (90 BASE) MCG/ACT inhaler as needed.      . promethazine (PHENERGAN) 25 MG tablet Take 25 mg by mouth every 12 (twelve) hours as needed. Take 1/2 or 1 whole table per dosage as needed.      . propranolol (INDERAL) 40 MG tablet Daily.      Marland Kitchen SYNTHROID 150 MCG tablet Daily.      . traMADol (ULTRAM) 50 MG tablet Every 6 hours as needed.        OB/GYN History: menarche at age 36 patient is postmenopausal no hormone replacement therapy.  Fertility Discussion: not applicable Prior History of Cancer: no prior history of cancers  Health Maintenance:  Colonoscopy unknown Bone Density unknown Last PAP smear unknown  ECOG PERFORMANCE STATUS: 3 - Symptomatic, >50% confined to bed  Genetic Counseling/testing:patient will now prefer to genetic counseling  REVIEW OF SYSTEMS:  Patient is comfortable sitting in a wheelchair she is not able to get up onto my exam table today. She is complaining of having pain in the lumpectomy region as well as under the axilla. She is weak tired fatigued but she denies any nausea or vomiting she has no fevers chills or night sweats. She does have myalgias as well as arthralgias especially back pain. She is able to ambulate with a walker but usually gets significant amount of helps from her family.  PHYSICAL EXAMINATION: Blood pressure 130/79, pulse 68, temperature 98.9 F (37.2 C), temperature source Oral, resp. rate 18, height 5\' 2"  (1.575 m), weight 208 lb 4.8 oz (94.484 kg).  OZH:YQMVH, no distress, well nourished and well developed SKIN: skin color, texture, turgor are normal HEAD: Normocephalic EYES: PERRLA, EOMI EARS: External ears normal OROPHARYNX:no exudate and no erythema  NECK: no adenopathy LYMPH:  no palpable lymphadenopathy,  no hepatosplenomegaly BREAST: right breast reveals well-healed surgical scar with no nodularity left breast no masses or nipple discharge. LUNGS: clear to auscultation  HEART: regular rate & rhythm ABDOMEN:abdomen soft, non-tender, normal bowel sounds and no masses or organomegaly BACK: No CVA tenderness EXTREMITIES:+1 edema  NEURO: alert & oriented x 3 with fluent speech, no focal motor/sensory deficits     STUDIES/RESULTS: Dg Chest 2 View  11/18/2012  *RADIOLOGY REPORT*  Clinical Data: Preop for lumpectomy, history of asthma and diabetes  CHEST - 2 VIEW  Comparison: Chest x-ray of 09/10/2012, 72,013, and CT chest of 05/28/2010  Findings: Prominent markings overlying the right hilum and right infrahilar region appear to be due to slight rotation, with no mass in that region on prior chest x-ray. Attention to this area on follow-up chest x-ray is recommended.  Mild cardiomegaly is stable. No infiltrate or effusion is seen.  There are degenerative changes throughout the thoracic spine and the descending thoracic aorta is ectatic.  IMPRESSION: No definite active process.  Stable cardiomegaly.  Somewhat prominent right infrahilar markings probably due to slight rotation, but recommend attention to this area on follow-up chest x- ray.   Original Report Authenticated By: Dwyane Dee, M.D.    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  BUN and creatinine were obtained on site at [facility] at [address] Results:  BUN 12 mg/dL,  Creatinine 1.0 mg/dL.  BILATERAL BREAST MRI WITH AND WITHOUT CONTRAST  Technique: Multiplanar, multisequence MR images of both breasts were obtained prior to and following the intravenous administration of 17ml of MultiHance.  Three dimensional images were evaluated at the independent DynaCad workstation.  Comparison:  Recent  mammograms and recent biopsy report from [facility].  There are no ultrasound images or reports available at this time.  Findings: Mild to moderate background parenchymal enhancement in both breasts.  2.7 x 2.4 x 2.2 cm oval, irregularly marginated enhancing mass deep in the upper outer quadrant of the right breast with an associated biopsy marker clip at the anterior aspect of the mass.  This corresponds to the recently biopsied invasive ductal carcinoma and has a mixture of enhancement kinetics, including rapid washin/washout.  This is not adjacent to or involve the pectoralis muscle.  There is linear enhancement extending anteriorly from the mass, corresponding to the location of the biopsy tract, seen on the post clip placement mammogram images dated 10/19/2012, not seen prior to the biopsy.  At the anterior aspect of the the biopsy tract, a normal appearing enhancing intramammary lymph node is demonstrated.  There is also a similar appearing enhancing intramammary lymph node in the lower outer quadrant of the left breast.  No additional masses or areas of enhancement suspicious for malignancy in either breast.  A single enlarged level I right axillary lymph node is demonstrated measuring 1.8 x 1.2 cm on image number 52 of the first post biopsy sequence.  No other abnormal appearing right axillary lymph nodes are seen and no abnormal appearing internal mammary lymph nodes are demonstrated.  IMPRESSION:  1.  2.7 x 2.4 x 2.2 cm biopsy-proven invasive ductal carcinoma deep in the upper outer quadrant of the right breast. 2.  1.8 cm biopsy-proven metastatic level I right axillary lymph node. 3.  Otherwise, unremarkable examination.  RECOMMENDATION: Treatment plan  THREE-DIMENSIONAL MR IMAGE RENDERING ON INDEPENDENT WORKSTATION:  Three-dimensional MR images were rendered by post-processing of the original MR data on an independent workstation.  The three- dimensional MR images were interpreted, and findings  were reported in the accompanying complete MRI report for this study.  BI-RADS CATEGORY 6:  Known biopsy-proven malignancy - appropriate action should be taken.   Original Report Authenticated By: Beckie Salts, M.D.    Nm Pet Image Initial (pi) Skull Base To Thigh  11/16/2012  *RADIOLOGY REPORT*  Clinical Data: Initial treatment strategy for right breast cancer.  NUCLEAR MEDICINE PET SKULL BASE TO THIGH  Fasting Blood Glucose:  102  Technique:  18.9 mCi F-18 FDG was injected intravenously. CT data was obtained and used for attenuation correction and anatomic localization only.  (This was not acquired as a diagnostic CT examination.) Additional exam technical data entered on technologist worksheet.  Comparison:  05/28/2010  Findings:  Neck: No hypermetabolic lymph nodes or mass within the soft tissues of the neck.  Chest:  Mass within the lateral right breast measures 2.2 cm and has  an SUV max equal to 23.4, image 91.  Malignant range FDG uptake is associated with right axillary lymph node.  This measures 1.2 cm and has an SUV max equal to 12.1, image 79.  Hypermetabolic right retropectoral lymph node measures 1.2 cm and has an SUV max equal to 10.1.  No hypermetabolic mediastinal or hilar nodes.  No suspicious pulmonary nodules on the CT scan.  Abdomen/Pelvis:  No abnormal hypermetabolic activity within the liver, pancreas, adrenal glands, or spleen.  No hypermetabolic lymph nodes in the abdomen or pelvis.  Skeleton:  No focal hypermetabolic activity to suggest skeletal metastasis.  IMPRESSION:  1.  Right breast mass with malignant range FDG uptake compatible with primary breast carcinoma. 2.  Hypermetabolic right axillary and right retropectoral lymph nodes.   Original Report Authenticated By: Signa Kell, M.D.      LABS:    Chemistry      Component Value Date/Time   NA 145 12/06/2012 1446   NA 139 11/23/2012 0655   K 3.9 12/06/2012 1446   K 3.5 11/23/2012 0655   CL 104 12/06/2012 1446   CL 102 11/23/2012  0655   CO2 30* 12/06/2012 1446   CO2 30 11/23/2012 0655   BUN 18.9 12/06/2012 1446   BUN 15 11/23/2012 0655   CREATININE 1.1 12/06/2012 1446   CREATININE 1.03 11/23/2012 0655      Component Value Date/Time   CALCIUM 9.3 12/06/2012 1446   CALCIUM 8.6 11/23/2012 0655   ALKPHOS 113 12/06/2012 1446   AST 13 12/06/2012 1446   ALT 11 12/06/2012 1446   BILITOT 0.33 12/06/2012 1446      Lab Results  Component Value Date   WBC 5.8 12/06/2012   HGB 13.1 12/06/2012   HCT 39.5 12/06/2012   MCV 91.3 12/06/2012   PLT 184 12/06/2012   PATHOLOGY: Diagnosis 1. Lymph node, biopsy, Right axillary - ONE LYMPH NODE, POSITIVE FOR METASTATIC MAMMARY CARCINOMA (1/1). - EXTRACAPSULAR EXTENSION IS PRESENT. - SEE COMMENT. 2. Lymph node, biopsy, Right axillary - ONE LYMPH NODE, POSITIVE FOR METASTATIC MAMMARY CARCINOMA (1/1). - EXTRACAPSULAR EXTENSION IS PRESENT. - SEE COMMENT. 3. Lymph nodes, regional resection, right axillary - SIX OF NINE LYMPH NODES POSITIVE FOR METASTATIC MAMMARY CARCINOMA (6/9). - EXTRACAPSULAR EXTENSION IS PRESENT. - SEE COMMENT. 4. Breast, lumpectomy, Right - INVASIVE MODERATELY DIFFERENTIATED MAMMARY CARCINOMA WITH A MIXED DUCTAL AND LOBULAR PHENOTYPE, SPANNING 2.7 CM IN GREATEST DIMENSION. - ASSOCIATED LOBULAR CARCINOMA IN SITU WITH NECROSIS AND CALCIFICATION. - LYMPH/VASCULAR INVASION IS PRESENT. - INVASIVE CARCINOMA IS LESS THAN 0.1 CM FROM POSTERIOR MARGIN. - OTHER MARGINS ARE NEGATIVE. - SEE ONCOLOGY TEMPLATE. 5. Breast, excision, Right additional deep margin - LOBULAR NEOPLASIA (ATYPICAL LOBULAR HYPERPLASIA). - FOCUS OF ATYPICAL CAUTERIZED GLANDS AT CAUTERIZED MARGIN. - SEE COMMENT. Microscopic Comment 1. A cytokeratin AE1/AE3 immunohistochemical stain and an E-cadherin stain are both performed on the first right axillary lymph node. The cytokeratin stain is positive while the E-cadherin stain is largely negative. The findings are consistent with metastatic mammary carcinoma which  appears in the metastasis to have a predominantly lobular phenotype. 2. and 3. The positive lymph nodes in specimens two and three both show metastatic mammary carcinoma with a pattern similar to the metastatic carcinoma in part 1. 1 of 4 FINAL for Amber, HASE [name] [id number]) Microscopic Comment(continued) 4. BREAST, INVASIVE TUMOR, WITH LYMPH NODE SAMPLING Specimen, including laterality: Right partial breast with additional right deep margin and right axillary lymph nodes. Procedure: Right breast lumpectomy with additional deep margin excision  in right axillary lymph node resection. Grade: Tubule formation: 2. Nuclear pleomorphism: 2. Mitotic: 2. Tumor size (gross measurement): 2.7 cm. Margins: Invasive, distance to closest margin: Less than 0.1 cm, posterior margin. Lymphovascular invasion: Yes, identified. Ductal carcinoma in situ: Not present. 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 Tumor focality: Unifocal. Treatment effect: Not applicable. Extent of tumor: Tumor confined to breast parenchyma. Lymph nodes: # examined: 8. Lymph nodes with metastasis: 11. Macrometastasis: (> 2.0 mm): 8. Extracapsular extension: Yes. Breast prognostic profile: Performed on previous case [id number]. Estrogen receptor: 7%, positive. Progesterone receptor: 0%, negative. HER-2/neu by CISH: 2.83, amplified. Ki-67: 19%, low. Non-neoplastic breast: Unremarkable. TNM: pT2, pN2a, MX. Comments: An E-cadherin and cytokeratin AE1/AE3 immunohistochemical stain are performed on the primary tumor. The cytokeratin AE1/AE3 stain highlights the invasive carcinoma. The E-cadherin stain shows positivity in some areas of invasive tumor while it is completely negative in other areas. A second E-cadherin stain is also performed in an area of lobular carcinoma in situ with necrosis -- the  stain fails to show positivity in the proliferation, confirming the diagnosis. The morphology coupled with the staining pattern is consistent with the above diagnosis. Dr. Frederica Kuster has seen two representative tumor slides (4D, 42F) in consultation with agreement of primary tumor type and presence of lobular carcinoma in situ. 5. There is a small focus of atypical glands with marked cautery artifact at the cauterized margin. The significance of these glands is unclear, although they may represent a small focus of atypical ductal hyperplasia. Definitive classification is not possible due to the marked amount of cautery artifact. An E-cadherin immunohistochemical stain is performed on a single block in the additional deep margin specimen. It shows loss of staining in an area of epithelial proliferation, confirming the diagnosis of lobular neoplasia (atypical lobular hyperplasia). (RAH:eps 11/24/12) Zandra Abts MD  ASSESSMENT    79 year old female with  #1 new diagnosis of stage III invasive ductal carcinoma of the right breast patient is status post partial mastectomy with limited axillary lymph node dissection.the final pathology did reveal a 2.7 cm invasive ductal carcinoma ER-positive 7% HER-2/neu amplified at 2.83 positive axillary lymph node 8/11 positive for metastatic disease.postoperatively patient is doing well. I discussed adjuvant therapy with the patient and her daughters we discussed chemotherapy with Herceptin. We discussed the rationale for this. I would give herTaxotere carboplatinum and Herceptin or we certainly could give her Herceptin with an antiestrogen therapy.  #2patient's daughters were very concerned about patient's overall performance status and well-being. They felt that chemotherapy or any kind of IV therapy would be too harsh on their mother. They are opting to receive radiation therapy for local control. As far as systemic therapy is concerned it may possibly consider  antiestrogen therapy.  #3 at this time I did refer them to Dr. Antony Blackbird for further evaluation for radiation oncology perspective.  Clinical Trial Eligibility:no Multidisciplinary conference discussion yes     PLAN:    #1 proceed with radiation oncology consultation.  #2 patient would be considered for adjuvant therapy consisting of chemotherapy plus Herceptin. if the patient agrees.however we certainly could also give her Herceptin with an antiestrogen drug such as Arimidex.  #3 I will see her back in a few weeks time.     Thank you so much for allowing me to participate in the care of Amber [name]. I will continue to follow up the patient with you and  assist in her care.  All questions were answered. The patient knows to call the clinic with any problems, questions or concerns. We can certainly see the patient much sooner if necessary.  I spent 60 minutes counseling the patient face to face. The total time spent in the appointment was 60 minutes.  Drue Second, MD Medical/Oncology [facility] [phone] (beeper) [phone] (Office)  12/06/2012, 3:49 PM

## 2012-12-06 NOTE — Progress Notes (Deleted)
Amber [name] [id number] 03/17/1926 79 y.o. 12/06/2012 3:53 PM  CC  [name], MD [address] [place] [address] Dr. Forrestine Him  REASON FOR CONSULTATION:  ***  STAGE:     REFERRING PHYSICIAN: ***  HISTORY OF PRESENT ILLNESS:  Amber [name] is a 79 y.o. female.  ***   Past Medical History: Past Medical History  Diagnosis Date  . Arthritis   . Asthma   . Cancer     breast  . Diabetes mellitus without complication   . GERD (gastroesophageal reflux disease)   . Hyperlipidemia   . Hypertension   . Neuromuscular disorder   . Thyroid disease   . Hearing loss   . Visual disturbance   . Hypothyroidism   . Shortness of breath     occasionally with "excitement"    Past Surgical History: Past Surgical History  Procedure Date  . Back surgery 1980 - approximate  . Carpal tunnel release unsure of date    both hands  . Rotator cuff repair unsure of date    not sure which shoulder  . Elbow surgery unsure of date  . Cervical spine surgery unsure of date  . Abdominal hysterectomy   . Breast lumpectomy with nedle localization and axillary lymph node dissection 11/22/2012    Procedure: BREAST LUMPECTOMY WITH NEDLE LOCALIZATION AND AXILLARY LYMPH NODE DISSECTION;  Surgeon: Lodema Pilot, DO;  Location: [facility] OR;  Service: General;  Laterality: Right;    Family History: Family History  Problem Relation Age of Onset  . Cancer Mother     uterine  . Cancer Brother     oral, throat    Social History History  Substance Use Topics  . Smoking status: Never Smoker   . Smokeless tobacco: Never Used  . Alcohol Use: No    Allergies: No Known Allergies  Current Medications: Current Outpatient Prescriptions  Medication Sig Dispense Refill  . ADVAIR DISKUS 250-50 MCG/DOSE AEPB as needed.      Marland Kitchen amLODipine (NORVASC) 5 MG tablet Daily.      . diazepam (VALIUM) 5 MG tablet Three times daily as needed.      . furosemide (LASIX) 80 MG  tablet Daily.      Marland Kitchen HYDROcodone-acetaminophen (NORCO/VICODIN) 5-325 MG per tablet Take 1 tablet by mouth every 4 (four) hours as needed.  40 tablet  0  . potassium chloride SA (K-DUR,KLOR-CON) 20 MEQ tablet Daily.      . pravastatin (PRAVACHOL) 40 MG tablet Daily.      Marland Kitchen PROAIR HFA 108 (90 BASE) MCG/ACT inhaler as needed.      . promethazine (PHENERGAN) 25 MG tablet Take 25 mg by mouth every 12 (twelve) hours as needed. Take 1/2 or 1 whole table per dosage as needed.      . propranolol (INDERAL) 40 MG tablet Daily.      Marland Kitchen SYNTHROID 150 MCG tablet Daily.      . traMADol (ULTRAM) 50 MG tablet Every 6 hours as needed.        OB/GYN History: menarche at 89. Menopause at age 33, G30P3, first pregnancy at age 76  Fertility Discussion: N/A Prior History of Cancer: N/A  Health Maintenance:  Colonoscopy 4-5 years ago Bone Density yes  Last PAP smear many years  ECOG PERFORMANCE STATUS: 2 - Symptomatic, <50% confined to bed  Genetic Counseling/testing: n/a  REVIEW OF SYSTEMS:  {Ros - complete:30496}  PHYSICAL EXAMINATION: Blood pressure 130/79, pulse 68, temperature 98.9 F (37.2 C), temperature  source Oral, resp. rate 18, height 5\' 2"  (1.575 m), weight 208 lb 4.8 oz (94.484 kg).  RAL:{CHL ONC PE GENERAL:[phone]} SKIN: {CHL ONC PE JYNW:2956213086} HEAD: {CHL ONC PE VHQI:6962952841} EYES: {CHL ONC PE LKGM:0102725366} EARS: {CHL ONC PE YQIH:4742595638} OROPHARYNX:{CHL ONC PE OROPHARYNX:682 176 9481}  NECK: {CHL ONC PE VFIE:3329518841} LYMPH:  {CHL ONC PE YSAYT:0160109323} BREAST:{CHL ONC PE BREAST:9294964381} LUNGS: {CHL ONC PE FTDDU:2025427062} HEART: {CHL ONC PE BJSEG:3151761607} ABDOMEN:{CHL ONC PE ABDOMEN:[phone]} BACK: {CHL ONC PE PXTG:6269485462} EXTREMITIES:{CHL ONC PE EXTREMITIES:[phone]}  NEURO: {CHL ONC PE NEURO:[phone]}     STUDIES/RESULTS: Dg Chest 2 View  11/18/2012  *RADIOLOGY REPORT*  Clinical Data: Preop for lumpectomy, history of asthma and  diabetes  CHEST - 2 VIEW  Comparison: Chest x-ray of 09/10/2012, 72,013, and CT chest of 05/28/2010  Findings: Prominent markings overlying the right hilum and right infrahilar region appear to be due to slight rotation, with no mass in that region on prior chest x-ray. Attention to this area on follow-up chest x-ray is recommended.  Mild cardiomegaly is stable. No infiltrate or effusion is seen.  There are degenerative changes throughout the thoracic spine and the descending thoracic aorta is ectatic.  IMPRESSION: No definite active process.  Stable cardiomegaly.  Somewhat prominent right infrahilar markings probably due to slight rotation, but recommend attention to this area on follow-up chest x- ray.   Original Report Authenticated By: Dwyane Dee, M.D.    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  BUN and creatinine were obtained on site at [facility] at [address] Results:  BUN 12 mg/dL,  Creatinine 1.0 mg/dL.  BILATERAL BREAST MRI WITH AND WITHOUT CONTRAST  Technique: Multiplanar, multisequence MR images of both breasts were obtained prior to and following the intravenous administration of 17ml of MultiHance.  Three dimensional images were evaluated at the independent DynaCad workstation.  Comparison:  Recent mammograms and recent biopsy report from [facility].  There are no ultrasound images or reports available at this time.  Findings: Mild to moderate background parenchymal enhancement in both breasts.  2.7 x 2.4 x 2.2 cm oval, irregularly marginated enhancing mass deep in the upper outer quadrant of the right breast with an associated biopsy marker clip at the anterior aspect of the mass.  This corresponds to the recently biopsied invasive ductal carcinoma and has a mixture of enhancement kinetics, including rapid washin/washout.  This is  not adjacent to or involve the pectoralis muscle.  There is linear enhancement extending anteriorly from the mass, corresponding to the location of the biopsy tract, seen on the post clip placement mammogram images dated 10/19/2012, not seen prior to the biopsy.  At the anterior aspect of the the biopsy tract, a normal appearing enhancing intramammary lymph node is demonstrated.  There is also a similar appearing enhancing intramammary lymph node in the lower outer quadrant of the left breast.  No additional masses or areas of enhancement suspicious for malignancy in either breast.  A single enlarged level I right axillary lymph node is demonstrated measuring 1.8 x 1.2 cm on image number 52 of the first post biopsy sequence.  No other abnormal appearing right axillary lymph nodes are seen and no abnormal appearing internal mammary lymph nodes are demonstrated.  IMPRESSION:  1.  2.7 x 2.4 x 2.2 cm biopsy-proven invasive ductal carcinoma deep in the upper outer quadrant of the right breast. 2.  1.8 cm biopsy-proven metastatic level I right axillary lymph node. 3.  Otherwise, unremarkable examination.  RECOMMENDATION: Treatment plan  THREE-DIMENSIONAL MR IMAGE RENDERING ON INDEPENDENT WORKSTATION:  Three-dimensional MR images were rendered by post-processing of the original MR data on an independent workstation.  The three- dimensional MR images were interpreted, and findings were reported in the accompanying complete MRI report for this study.  BI-RADS CATEGORY 6:  Known biopsy-proven malignancy - appropriate action should be taken.   Original Report Authenticated By: Beckie Salts, M.D.    Nm Pet Image Initial (pi) Skull Base To Thigh  11/16/2012  *RADIOLOGY REPORT*  Clinical Data: Initial treatment strategy for right breast cancer.  NUCLEAR MEDICINE PET SKULL BASE TO THIGH  Fasting Blood Glucose:  102  Technique:  18.9 mCi F-18 FDG was injected intravenously. CT data was obtained and used for attenuation correction  and anatomic localization only.  (This was not acquired as a diagnostic CT examination.) Additional exam technical data entered on technologist worksheet.  Comparison:  05/28/2010  Findings:  Neck: No hypermetabolic lymph nodes or mass within the soft tissues of the neck.  Chest:  Mass within the lateral right breast measures 2.2 cm and has an SUV max equal to 23.4, image 91.  Malignant range FDG uptake is associated with right axillary lymph node.  This measures 1.2 cm and has an SUV max equal to 12.1, image 79.  Hypermetabolic right retropectoral lymph node measures 1.2 cm and has an SUV max equal to 10.1.  No hypermetabolic mediastinal or hilar nodes.  No suspicious pulmonary nodules on the CT scan.  Abdomen/Pelvis:  No abnormal hypermetabolic activity within the liver, pancreas, adrenal glands, or spleen.  No hypermetabolic lymph nodes in the abdomen or pelvis.  Skeleton:  No focal hypermetabolic activity to suggest skeletal metastasis.  IMPRESSION:  1.  Right breast mass with malignant range FDG uptake compatible with primary breast carcinoma. 2.  Hypermetabolic right axillary and right retropectoral lymph nodes.   Original Report Authenticated By: Signa Kell, M.D.      LABS:    Chemistry      Component Value Date/Time   NA 145 12/06/2012 1446   NA 139 11/23/2012 0655   K 3.9 12/06/2012 1446   K 3.5 11/23/2012 0655   CL 104 12/06/2012 1446   CL 102 11/23/2012 0655   CO2 30* 12/06/2012 1446   CO2 30 11/23/2012 0655   BUN 18.9 12/06/2012 1446   BUN 15 11/23/2012 0655   CREATININE 1.1 12/06/2012 1446   CREATININE 1.03 11/23/2012 0655      Component Value Date/Time   CALCIUM 9.3 12/06/2012 1446   CALCIUM 8.6 11/23/2012 0655   ALKPHOS 113 12/06/2012 1446   AST 13 12/06/2012 1446   ALT 11 12/06/2012 1446   BILITOT 0.33 12/06/2012 1446      Lab Results  Component Value Date   WBC 5.8 12/06/2012   HGB 13.1 12/06/2012   HCT 39.5 12/06/2012   MCV 91.3 12/06/2012   PLT 184 12/06/2012      PATHOLOGY:  ASSESSMENT     ***  Clinical Trial Eligibility:*** Multidisciplinary conference discussion***    PLAN:    ***       Discussion: Patient is being treated per NCCN breast cancer care guidelines appropriate for stage.***   Thank you so much for allowing me to participate in the care of Kynzli T Speigner. I will continue to follow up the patient with you and assist in her care.  All  questions were answered. The patient knows to call the clinic with any problems, questions or concerns. We can certainly see the patient much sooner if necessary.  I spent {CHL ONC TIME VISIT - ZOXWR:[id number]} counseling the patient face to face. The total time spent in the appointment was {CHL ONC TIME VISIT - BJYNW:2956213086}.  @KKsign @ 12/06/2012, 3:53 PM

## 2012-12-06 NOTE — Patient Instructions (Addendum)
Proceed with radiation first  I will see you back in 2 months in follow up

## 2012-12-06 NOTE — Progress Notes (Signed)
Checked in new pt with no financial concerns. °

## 2012-12-09 ENCOUNTER — Encounter: Payer: Self-pay | Admitting: Radiation Oncology

## 2012-12-09 ENCOUNTER — Ambulatory Visit (INDEPENDENT_AMBULATORY_CARE_PROVIDER_SITE_OTHER): Payer: Medicare PPO | Admitting: General Surgery

## 2012-12-09 ENCOUNTER — Encounter (INDEPENDENT_AMBULATORY_CARE_PROVIDER_SITE_OTHER): Payer: Self-pay

## 2012-12-09 ENCOUNTER — Encounter (INDEPENDENT_AMBULATORY_CARE_PROVIDER_SITE_OTHER): Payer: Self-pay | Admitting: General Surgery

## 2012-12-09 ENCOUNTER — Ambulatory Visit
Admission: RE | Admit: 2012-12-09 | Discharge: 2012-12-09 | Disposition: A | Discharge status: Home / Self Care | Payer: Medicare PPO | Source: Ambulatory Visit | Attending: Radiation Oncology | Admitting: Radiation Oncology

## 2012-12-09 VITALS — BP 130/76 | HR 74 | Temp 97.6°F | Resp 18 | Ht 62.0 in | Wt 200.4 lb

## 2012-12-09 VITALS — BP 135/71 | HR 66 | Temp 98.4°F | Resp 20 | Ht 62.0 in | Wt 207.4 lb

## 2012-12-09 DIAGNOSIS — E119 Type 2 diabetes mellitus without complications: Secondary | ICD-10-CM | POA: Insufficient documentation

## 2012-12-09 DIAGNOSIS — K219 Gastro-esophageal reflux disease without esophagitis: Secondary | ICD-10-CM | POA: Insufficient documentation

## 2012-12-09 DIAGNOSIS — E785 Hyperlipidemia, unspecified: Secondary | ICD-10-CM | POA: Insufficient documentation

## 2012-12-09 DIAGNOSIS — C50419 Malignant neoplasm of upper-outer quadrant of unspecified female breast: Secondary | ICD-10-CM

## 2012-12-09 DIAGNOSIS — Z5189 Encounter for other specified aftercare: Secondary | ICD-10-CM

## 2012-12-09 DIAGNOSIS — Z79899 Other long term (current) drug therapy: Secondary | ICD-10-CM | POA: Insufficient documentation

## 2012-12-09 DIAGNOSIS — Z4889 Encounter for other specified surgical aftercare: Secondary | ICD-10-CM

## 2012-12-09 DIAGNOSIS — I10 Essential (primary) hypertension: Secondary | ICD-10-CM | POA: Insufficient documentation

## 2012-12-09 NOTE — Progress Notes (Signed)
Please see the Nurse Progress Note in the MD Initial Consult Encounter for this patient. 

## 2012-12-09 NOTE — Progress Notes (Signed)
Patient here new consult via w/c, carrying cane, for Right Breast Cancer, Alert,oriented x3, pain an 8 on 1-10 scale, under right axilla"annoying " stated, saw Dr. Lodema Pilot today, patient still feels like something stuck under axilla, depressed, fweels hopeless states also, will notify social worker,  To seee or call patient, 2 daughters with patient, no c/o nausea, eating and drinking well ,lives alone, but daughter lives next door ,sees patient daily 3:06 PM

## 2012-12-09 NOTE — Progress Notes (Signed)
79 year old female.  Referred by Dr. Welton Flakes to Dr. Roselind Messier. Dr. Milta Deiters consult note incomplete at this time. Patient s/p right breast needle localized lumpectomy with right axillary dissection on 11/22/2012. Pathology revealed invasive moderately differentiate carcinoma of the right breast node POSITIVE. ER positive, PR negative. HER2 amplified.  NKDA No hx of radiation therapy No indication of a pacemaker

## 2012-12-09 NOTE — Addendum Note (Signed)
Encounter addended by: Billie Lade, MD on: 12/09/2012  7:20 PM<BR>     Documentation filed: Orders

## 2012-12-09 NOTE — Progress Notes (Signed)
Radiation Oncology         [phone] ________________________________  Initial outpatient Consultation  Name: Amber [name] MRN: [id number]  Date: 12/09/2012  DOB: June 14, 1926  BM:WUXLK,GMWNUU DENNIS, MD  Victorino December, MD   REFERRING PHYSICIAN: Victorino December, MD  DIAGNOSIS: The encounter diagnosis was Cancer of upper-outer quadrant of female breast.  HISTORY OF PRESENT ILLNESS::Amber [name] is a 79 y.o. female who is seen out of the courtesy of Dr. Biagio Quint and Dr. Welton Flakes for consideration for radiation therapy as part of the management of patient's locally advanced right breast cancer. Late last year on routine screening mammography the patient was noted to have a lesion in the upper-outer quadrant of the right breast. She was also noted to have a suspicious lymph node in the right axilla.  she underwent biopsies of both areas which revealed invasive ductal carcinoma the right breast with metastatic carcinoma noted in the right axillary lymph node. HER-2/neu was amplified at 2.83. The tumor was partly staining at 7% for estrogen receptor and negative for progesterone receptor. MRI of the breast area revealed a 2.7 x 2.4 x 2.2 cm lesion deep within the upper-outer quadrant of the right breast. There is also noted to be a suspicious level I right axillary lymph node which had been previously biopsied.  a PET scan was performed which revealed malignant uptake in the right breast lesion as well as hypermetabolic activity in the right axillary and right retro-pectoral lymph node areas. The patient proceeded to undergo partial mastectomy and limited axillary dissection with plans for postoperative radiation treatments. On pathologic review the patient was found to have a 2.7 cm invasive moderately differentiated mammary carcinoma with mixed ductal and lobular phenotype 6. There was lymphovascular space invasion noted. The initial deep margin was close with additional tissue was taken  intraoperatively clearing the deep margin.   8/11 lymph nodes from the right axillary area showed metastasis. In addition there was extracapsular extension noted.  PREVIOUS RADIATION THERAPY: No  PAST MEDICAL HISTORY:  has a past medical history of Arthritis; Asthma; Diabetes mellitus without complication; GERD (gastroesophageal reflux disease); Hyperlipidemia; Hypertension; Neuromuscular disorder; Thyroid disease; Hearing loss; Visual disturbance; Hypothyroidism; Shortness of breath; and Breast cancer.    PAST SURGICAL HISTORY: Past Surgical History  Procedure Date  . Back surgery 1980 - approximate  . Carpal tunnel release unsure of date    both hands  . Rotator cuff repair unsure of date    not sure which shoulder  . Elbow surgery unsure of date  . Cervical spine surgery unsure of date  . Abdominal hysterectomy   . Breast lumpectomy with nedle localization and axillary lymph node dissection 11/22/2012    Procedure: BREAST LUMPECTOMY WITH NEDLE LOCALIZATION AND AXILLARY LYMPH NODE DISSECTION;  Surgeon: Lodema Pilot, DO;  Location: [facility] OR;  Service: General;  Laterality: Right;    FAMILY HISTORY: family history includes Cancer in her brother and mother. breast cancer in daughter  SOCIAL HISTORY:  reports that she has never smoked. She has never used smokeless tobacco. She reports that she does not drink alcohol or use illicit drugs.  ALLERGIES: Review of patient's allergies indicates no known allergies.  MEDICATIONS:  Current Outpatient Prescriptions  Medication Sig Dispense Refill  . ADVAIR DISKUS 250-50 MCG/DOSE AEPB Inhale 1 puff into the lungs 2 (two) times daily.       Marland Kitchen amLODipine (NORVASC) 5 MG tablet Take 5 mg by mouth Daily.       Marland Kitchen  diazepam (VALIUM) 5 MG tablet Three times daily as needed.      . furosemide (LASIX) 80 MG tablet Take 80 mg by mouth Daily.       Marland Kitchen HYDROcodone-acetaminophen (NORCO/VICODIN) 5-325 MG per tablet Take 1 tablet by mouth every 4 (four) hours as  needed.  40 tablet  0  . potassium chloride SA (K-DUR,KLOR-CON) 20 MEQ tablet Take 20 mEq by mouth daily.       . pravastatin (PRAVACHOL) 40 MG tablet Take 40 mg by mouth Daily.       Marland Kitchen PROAIR HFA 108 (90 BASE) MCG/ACT inhaler Inhale 1 puff into the lungs as needed.       . promethazine (PHENERGAN) 25 MG tablet Take 25 mg by mouth every 12 (twelve) hours as needed. Take 1/2 or 1 whole table per dosage as needed.      . propranolol (INDERAL) 40 MG tablet Take 40 mg by mouth Daily.       Marland Kitchen SYNTHROID 150 MCG tablet Take 150 mcg by mouth Daily.       . traMADol (ULTRAM) 50 MG tablet Every 6 hours as needed.        REVIEW OF SYSTEMS:  A 15 point review of systems is documented in the electronic medical record. This was obtained by the nursing staff. However, I reviewed this with the patient to discuss relevant findings and make appropriate changes. Patient has soreness in the right breast particularly in the axillary region. She starting to have improvement in her right arm mobility. She denies any new bony pain headaches dizziness or blurred vision.   PHYSICAL EXAM:  height is 5\' 2"  (1.575 m) and weight is 207 lb 6.4 oz (94.076 kg). Her oral temperature is 98.4 F (36.9 C). Her blood pressure is 135/71 and her pulse is 66. Her respiration is 20.  this is a very pleasant 79 year old female in no acute distress. She is accompanied by her 2 daughters on exam today. [name] I treated approximately 10 years ago for breast cancer. She continues to do well. The pupils showed bilateral arcus senilis present. The extraocular eye movements are intact. The tongue is midline. There is no secondary infection noted the oral cavity or posterior pharynx. Patient has dentures in place. Examination of the neck and supraclavicular region reveals no evidence of adenopathy. The axillary areas are free of adenopathy. The lungs are clear to auscultation. The heart has a regular rhythm and rate. The abdomen is soft  and nontender with normal bowel sounds. On neurological examination motor strength is 5 out of 5 in the proximal and distal muscle groups of the upper lower extremities. Peripheral pulses are good. Patient has some edema in the ankle and foot areas.  examination left breast reveals no mass or nipple discharge. Examination of the right breast reveals a well healing scar in the upper-outer quadrant. There is surgical who in place along the lumpectomy scar. Patient also has a separate scar in the axillary region which is also healing well without signs of drainage or infection.   LABORATORY DATA:  Lab Results  Component Value Date   WBC 5.8 12/06/2012   HGB 13.1 12/06/2012   HCT 39.5 12/06/2012   MCV 91.3 12/06/2012   PLT 184 12/06/2012   Lab Results  Component Value Date   NA 145 12/06/2012   K 3.9 12/06/2012   CL 104 12/06/2012   CO2 30* 12/06/2012   Lab Results  Component Value Date  ALT 11 12/06/2012   AST 13 12/06/2012   ALKPHOS 113 12/06/2012   BILITOT 0.33 12/06/2012     RADIOGRAPHY: Dg Chest 2 View  11/18/2012  *RADIOLOGY REPORT*  Clinical Data: Preop for lumpectomy, history of asthma and diabetes  CHEST - 2 VIEW  Comparison: Chest x-ray of 09/10/2012, 72,013, and CT chest of 05/28/2010  Findings: Prominent markings overlying the right hilum and right infrahilar region appear to be due to slight rotation, with no mass in that region on prior chest x-ray. Attention to this area on follow-up chest x-ray is recommended.  Mild cardiomegaly is stable. No infiltrate or effusion is seen.  There are degenerative changes throughout the thoracic spine and the descending thoracic aorta is ectatic.  IMPRESSION: No definite active process.  Stable cardiomegaly.  Somewhat prominent right infrahilar markings probably due to slight rotation, but recommend attention to this area on follow-up chest x- ray.   Original Report Authenticated By: Dwyane Dee, M.D.    Nm Pet Image Initial (pi) Skull Base To Thigh  11/16/2012   *RADIOLOGY REPORT*  Clinical Data: Initial treatment strategy for right breast cancer.  NUCLEAR MEDICINE PET SKULL BASE TO THIGH  Fasting Blood Glucose:  102  Technique:  18.9 mCi F-18 FDG was injected intravenously. CT data was obtained and used for attenuation correction and anatomic localization only.  (This was not acquired as a diagnostic CT examination.) Additional exam technical data entered on technologist worksheet.  Comparison:  05/28/2010  Findings:  Neck: No hypermetabolic lymph nodes or mass within the soft tissues of the neck.  Chest:  Mass within the lateral right breast measures 2.2 cm and has an SUV max equal to 23.4, image 91.  Malignant range FDG uptake is associated with right axillary lymph node.  This measures 1.2 cm and has an SUV max equal to 12.1, image 79.  Hypermetabolic right retropectoral lymph node measures 1.2 cm and has an SUV max equal to 10.1.  No hypermetabolic mediastinal or hilar nodes.  No suspicious pulmonary nodules on the CT scan.  Abdomen/Pelvis:  No abnormal hypermetabolic activity within the liver, pancreas, adrenal glands, or spleen.  No hypermetabolic lymph nodes in the abdomen or pelvis.  Skeleton:  No focal hypermetabolic activity to suggest skeletal metastasis.  IMPRESSION:  1.  Right breast mass with malignant range FDG uptake compatible with primary breast carcinoma. 2.  Hypermetabolic right axillary and right retropectoral lymph nodes.   Original Report Authenticated By: Signa Kell, M.D.       IMPRESSION: T2 N2 right breast cancer.  Patient would be a good candidate for radiation therapy as part of breast conserving treatment and to  cover  the axillary area in light of  extensive lymph node involvement and extracapsular extension. Patient will also have coverage of the subpectoral area in light of PET scan findings showing suspicious involvement in this area.  After the patient completes radiation therapy she will be seen again by Dr. Welton Flakes for consideration  of adjuvant treatment  PLAN: Simulation and planning approximately 3 weeks from now. The patient will tentatively start her radiation treatment 6 weeks postop assuming continued wound healing.  I spent 60 minutes minutes face to face with the patient and family and more than 50% of that time was spent in counseling and/or coordination of care.   ------------------------------------------------  -----------------------------------  Billie Lade, PhD, MD

## 2012-12-09 NOTE — Progress Notes (Signed)
Subjective:     Patient ID: Amber [name], female   DOB: [birth year], 79 y.o.   MRN: [id number]  HPI This patient follows upabout 3 weeks status post right breast needle localized lumpectomy with sentinel lymph node biopsy and excision of right axillary lymph nodes for a T2 N2 right breast cancer. She is recovering well from her procedure but she still has some persistent "sharp" pains in her axilla it feels as though a suture is poking her. She has already seen her medical oncologist and is scheduled to see the radiation oncologist this afternoon for adjuvant therapies.  Her margins are negative but she had 6/9 lymph nodes positive for metastatic cancer. She denies any arm swelling.  Review of Systems     Objective:   Physical Exam No distress and nontoxic-appearing Her incisions are healing well without sign of infection. She has good cosmesis. I do not see any evidence of any postoperative complication on exam. She has no evidence of lymphedema.    Assessment:     Status post right breast lumpectomy with axillary lymph node dissection She has a T2 N2 right breast cancer. She is recovering okay from her procedure but she does have some postoperative discomfort in her axilla. Hopefully this will continue to improve with time. I do not see any evidence of any postoperative complications. Her deep margin was close but I took some additional deep margin and there was no evidence of any malignancy identified an additional posterior margin. I did not think that she will require any additional surgical treatment for now. I do agree with the adjuvant radiation therapy pleased that she is scheduled to see a radiation oncologist this afternoon. I will discuss with Dr. Welton Flakes any additional adjuvant therapies or hormonal therapies as well. She will follow up with me in about 3 months for repeat evaluation.    Plan:     Adjuvant therapy with medical and radiation oncology Followup in 3 months for repeat  evaluation or sooner when necessary

## 2012-12-09 NOTE — Addendum Note (Signed)
Encounter addended by: Billie Lade, MD on: 12/09/2012  7:10 PM<BR>     Documentation filed: Visit Diagnoses, Inpatient Notes, Notes Section

## 2012-12-10 ENCOUNTER — Encounter: Payer: Self-pay | Admitting: *Deleted

## 2012-12-10 ENCOUNTER — Ambulatory Visit: Admission: RE | Admit: 2012-12-10 | Payer: Medicare PPO | Source: Ambulatory Visit | Admitting: Radiation Oncology

## 2012-12-10 NOTE — Progress Notes (Signed)
[facility] Brief Psychosocial Assessment Clinical Social Work  Clinical Social Work was referred by Charity fundraiser for assessment of psychosocial needs.  Clinical Social Worker met with patient and pt's 2 daughters in exam room at [facility] to offer support and assess for needs.  Pt expressed feeling overwhelmed by the number of appointments, but recognized the importance.  Pt stated she had a strong support system in her family, but was also interested in the support services at [facility].  CSW provided pt with information on the support team and support services at [facility].  CSW and pt discussed the importance of emotional support, and CSW encouraged pt to call with any needs or concerns.       Tamala Julian, MSW, LCSW Clinical Social Worker [facility] [phone]

## 2012-12-11 ENCOUNTER — Encounter: Payer: Self-pay | Admitting: *Deleted

## 2012-12-11 NOTE — Progress Notes (Signed)
Mailed after appt letter to pt. 

## 2012-12-18 ENCOUNTER — Other Ambulatory Visit: Payer: Self-pay

## 2012-12-28 ENCOUNTER — Ambulatory Visit: Payer: Medicare PPO | Attending: Radiation Oncology | Admitting: *Deleted

## 2012-12-28 DIAGNOSIS — I890 Lymphedema, not elsewhere classified: Secondary | ICD-10-CM | POA: Insufficient documentation

## 2012-12-28 DIAGNOSIS — M25619 Stiffness of unspecified shoulder, not elsewhere classified: Secondary | ICD-10-CM | POA: Insufficient documentation

## 2012-12-28 DIAGNOSIS — M79609 Pain in unspecified limb: Secondary | ICD-10-CM | POA: Insufficient documentation

## 2012-12-28 DIAGNOSIS — IMO0001 Reserved for inherently not codable concepts without codable children: Secondary | ICD-10-CM | POA: Insufficient documentation

## 2013-01-03 ENCOUNTER — Telehealth: Payer: Self-pay | Admitting: Radiation Oncology

## 2013-01-03 NOTE — Telephone Encounter (Signed)
Returned patient's daughter, Augusto Garbe, call. Steward Drone concerned that her mother is unable to raise her arm above her head to allow for treatment. Steward Drone goes on to explain that physical therapy was set up but, "she hasn't had any therapy yet." Requested she bring her mother in tomorrow at 1030 for evaluation by Dr. Roselind Messier prior to potential si,ulation at 1100. She questioned weather conditions. Instructed her to do what was safest for her and her mother. Requested she contact staff if she felt it unsafe to come in tomorrow so that the appointment could be rescheduled and she verbalized understanding.

## 2013-01-03 NOTE — Telephone Encounter (Signed)
Faxed copy of signed PT orders back to [facility], [facility]. Confirmation fax of delivery obtained.

## 2013-01-03 NOTE — Telephone Encounter (Signed)
Phoned Cameron Sprang after receiving physical therapy orders. Explained to Ms. Cheree Ditto that her mother's simulation would need to be delayed for two weeks due to physical therapy. Provide Ms. Cheree Ditto with an appointment for simulation for her mother on 01/18/2013 at 1100. Encouraged her to call with future needs. She verbalized understanding of all reviewed. Routed message to Dr. Roselind Messier.

## 2013-01-04 ENCOUNTER — Ambulatory Visit: Payer: Medicare PPO | Admitting: Radiation Oncology

## 2013-01-12 ENCOUNTER — Ambulatory Visit: Payer: Medicare PPO | Attending: Radiation Oncology

## 2013-01-12 DIAGNOSIS — I890 Lymphedema, not elsewhere classified: Secondary | ICD-10-CM | POA: Insufficient documentation

## 2013-01-12 DIAGNOSIS — M79609 Pain in unspecified limb: Secondary | ICD-10-CM | POA: Insufficient documentation

## 2013-01-12 DIAGNOSIS — IMO0001 Reserved for inherently not codable concepts without codable children: Secondary | ICD-10-CM | POA: Insufficient documentation

## 2013-01-12 DIAGNOSIS — M25619 Stiffness of unspecified shoulder, not elsewhere classified: Secondary | ICD-10-CM | POA: Insufficient documentation

## 2013-01-13 ENCOUNTER — Ambulatory Visit: Payer: Medicare PPO

## 2013-01-18 ENCOUNTER — Ambulatory Visit
Admission: RE | Admit: 2013-01-18 | Discharge: 2013-01-18 | Disposition: A | Discharge status: Home / Self Care | Payer: Medicare PPO | Source: Ambulatory Visit | Attending: Radiation Oncology | Admitting: Radiation Oncology

## 2013-01-18 DIAGNOSIS — C50419 Malignant neoplasm of upper-outer quadrant of unspecified female breast: Secondary | ICD-10-CM | POA: Insufficient documentation

## 2013-01-18 DIAGNOSIS — Z510 Encounter for antineoplastic radiation therapy: Secondary | ICD-10-CM | POA: Insufficient documentation

## 2013-01-18 DIAGNOSIS — Z7982 Long term (current) use of aspirin: Secondary | ICD-10-CM | POA: Insufficient documentation

## 2013-01-18 DIAGNOSIS — R059 Cough, unspecified: Secondary | ICD-10-CM | POA: Insufficient documentation

## 2013-01-18 DIAGNOSIS — Z79899 Other long term (current) drug therapy: Secondary | ICD-10-CM | POA: Insufficient documentation

## 2013-01-19 NOTE — Progress Notes (Signed)
  Radiation Oncology         [phone] ________________________________  Name: Amber [name] MRN: [id number]  Date: 01/18/2013  DOB: 1925-11-21  SIMULATION AND TREATMENT PLANNING NOTE  DIAGNOSIS:  T2 N2 right breast cancer   NARRATIVE:  The patient was brought to the CT Simulation planning suite.  Identity was confirmed.  All relevant records and images related to the planned course of therapy were reviewed.  The patient freely provided informed written consent to proceed with treatment after reviewing the details related to the planned course of therapy. The consent form was witnessed and verified by the simulation staff.  Then, the patient was set-up in a stable reproducible  supine position for radiation therapy.  CT images were obtained.  Surface markings were placed.  The CT images were loaded into the planning software.  Then the target and avoidance structures were contoured.  Treatment planning then occurred.  The radiation prescription was entered and confirmed.  Then, I designed and supervised the construction of a total of 5 medically necessary complex treatment devices.  I have requested : Isodose Plan.  I have ordered:dose calc.   PLAN:  The patient will receive 45 Gy in 25 fractions followed by a boost to 61 Gy.   ________________________________  -----------------------------------  Billie Lade, PhD, MD

## 2013-01-20 ENCOUNTER — Encounter: Payer: Medicare PPO | Admitting: Physical Therapy

## 2013-01-25 ENCOUNTER — Ambulatory Visit: Payer: Medicare PPO | Admitting: Radiation Oncology

## 2013-01-25 ENCOUNTER — Ambulatory Visit: Payer: Medicare PPO | Admitting: Physical Therapy

## 2013-01-26 ENCOUNTER — Ambulatory Visit: Payer: Medicare PPO

## 2013-01-26 ENCOUNTER — Encounter: Payer: Self-pay | Admitting: Radiation Oncology

## 2013-01-26 ENCOUNTER — Ambulatory Visit
Admission: RE | Admit: 2013-01-26 | Discharge: 2013-01-26 | Disposition: A | Discharge status: Home / Self Care | Payer: Medicare PPO | Source: Ambulatory Visit | Attending: Radiation Oncology | Admitting: Radiation Oncology

## 2013-01-26 NOTE — Progress Notes (Signed)
  Radiation Oncology         [phone] ________________________________  Name: Amber [name] MRN: [id number]  Date: 01/26/2013  DOB: [birth year]  Simulation Verification Note  Status: outpatient  NARRATIVE: The patient was brought to the treatment unit and placed in the planned treatment position. The clinical setup was verified. Then port films were obtained and uploaded to the radiation oncology medical record software.  The treatment beams were carefully compared against the planned radiation fields. The position location and shape of the radiation fields was reviewed. They targeted volume of tissue appears to be appropriately covered by the radiation beams. Organs at risk appear to be excluded as planned.  Based on my personal review, I approved the simulation verification. The patient's treatment will proceed as planned.  -----------------------------------  Billie Lade, PhD, MD

## 2013-01-27 ENCOUNTER — Ambulatory Visit: Payer: Medicare PPO

## 2013-01-27 ENCOUNTER — Ambulatory Visit: Payer: Medicare PPO | Admitting: Physical Therapy

## 2013-01-27 ENCOUNTER — Ambulatory Visit
Admission: RE | Admit: 2013-01-27 | Discharge: 2013-01-27 | Disposition: A | Discharge status: Home / Self Care | Payer: Medicare PPO | Source: Ambulatory Visit | Attending: Radiation Oncology | Admitting: Radiation Oncology

## 2013-01-28 ENCOUNTER — Ambulatory Visit: Payer: Medicare PPO

## 2013-01-28 ENCOUNTER — Ambulatory Visit
Admission: RE | Admit: 2013-01-28 | Discharge: 2013-01-28 | Disposition: A | Discharge status: Home / Self Care | Payer: Medicare PPO | Source: Ambulatory Visit | Attending: Radiation Oncology | Admitting: Radiation Oncology

## 2013-01-31 ENCOUNTER — Ambulatory Visit
Admission: RE | Admit: 2013-01-31 | Discharge: 2013-01-31 | Disposition: A | Discharge status: Home / Self Care | Payer: Medicare PPO | Source: Ambulatory Visit | Attending: Radiation Oncology | Admitting: Radiation Oncology

## 2013-01-31 ENCOUNTER — Ambulatory Visit: Payer: Medicare PPO

## 2013-01-31 ENCOUNTER — Ambulatory Visit: Payer: Medicare PPO | Admitting: Physical Therapy

## 2013-02-01 ENCOUNTER — Ambulatory Visit
Admission: RE | Admit: 2013-02-01 | Discharge: 2013-02-01 | Disposition: A | Discharge status: Home / Self Care | Payer: Medicare PPO | Source: Ambulatory Visit | Attending: Radiation Oncology | Admitting: Radiation Oncology

## 2013-02-01 ENCOUNTER — Encounter: Payer: Self-pay | Admitting: Radiation Oncology

## 2013-02-01 ENCOUNTER — Ambulatory Visit: Payer: Medicare PPO

## 2013-02-01 VITALS — BP 117/67 | HR 100 | Resp 18 | Wt 207.2 lb

## 2013-02-01 DIAGNOSIS — C50411 Malignant neoplasm of upper-outer quadrant of right female breast: Secondary | ICD-10-CM

## 2013-02-01 MED ORDER — RADIAPLEXRX EX GEL
Freq: Once | CUTANEOUS | Status: AC
  Administered 2013-02-01: 09:00:00 via TOPICAL

## 2013-02-01 MED ORDER — ALRA NON-METALLIC DEODORANT (RAD-ONC)
1.0000 "application " | Freq: Once | TOPICAL | Status: AC
  Administered 2013-02-01: 1 via TOPICAL

## 2013-02-01 NOTE — Progress Notes (Signed)
[facility]    Radiation Oncology [address] [place]     Maryln Gottron, M.D. [place], [place] [address]               Billie Lade, M.D., Ph.D. Phone: [phone]      Molli Hazard A. Kathrynn Running, M.D. Fax: [phone]      Radene Gunning, M.D., Ph.D.         Lurline Hare, M.D.         Grayland Jack, M.D Weekly Treatment Management Note  Name: Amber [name]     MRN: [id number]        CSN: [id number] Date: 02/01/2013      DOB: [birth year]  CC: [name], MD         Juleen China    Status: Outpatient  Diagnosis: The encounter diagnosis was Cancer of upper-outer quadrant of female breast, right.  Current Dose: 7.2 Gy  Current Fraction: 4  Planned Dose: 61.0 Gy  Narrative: Amber [name] was seen today for weekly treatment management. The chart was checked and port films  were reviewed.  She is tolerating the treatments well at this time without any side effects.    Review of patient's allergies indicates no known allergies.  Current Outpatient Prescriptions  Medication Sig Dispense Refill  . ADVAIR DISKUS 250-50 MCG/DOSE AEPB Inhale 1 puff into the lungs 2 (two) times daily.       Marland Kitchen amLODipine (NORVASC) 5 MG tablet Take 5 mg by mouth Daily.       . diazepam (VALIUM) 5 MG tablet Three times daily as needed.      . furosemide (LASIX) 80 MG tablet Take 80 mg by mouth Daily.       Marland Kitchen HYDROcodone-acetaminophen (NORCO/VICODIN) 5-325 MG per tablet Take 1 tablet by mouth every 4 (four) hours as needed.  40 tablet  0  . non-metallic deodorant (ALRA) MISC Apply 1 application topically daily as needed.      . potassium chloride SA (K-DUR,KLOR-CON) 20 MEQ tablet Take 20 mEq by mouth daily.       . pravastatin (PRAVACHOL) 40 MG tablet Take 40 mg by mouth Daily.       Marland Kitchen PROAIR HFA 108 (90 BASE) MCG/ACT inhaler Inhale 1 puff into the lungs as needed.       . promethazine (PHENERGAN) 25 MG tablet Take 25 mg by mouth every 12 (twelve) hours as needed. Take 1/2 or 1 whole  table per dosage as needed.      . propranolol (INDERAL) 40 MG tablet Take 40 mg by mouth Daily.       Marland Kitchen SYNTHROID 150 MCG tablet Take 150 mcg by mouth Daily.       . traMADol (ULTRAM) 50 MG tablet Every 6 hours as needed.      . Wound Cleansers (RADIAPLEX EX) Apply topically.       No current facility-administered medications for this encounter.   Labs:    Physical Examination:  weight is 207 lb 3.2 oz (93.985 kg). Her blood pressure is 117/67 and her pulse is 100. Her respiration is 18.    Wt Readings from Last 3 Encounters:  02/01/13 207 lb 3.2 oz (93.985 kg)  12/09/12 207 lb 6.4 oz (94.076 kg)  12/09/12 200 lb 6 oz (90.89 kg)    The right breast,  axillary and supraclavicular region shows no appreciable radiation reaction at this time. Lungs - Normal respiratory effort, chest expands symmetrically. Lungs are clear to  auscultation, no crackles or wheezes.  Heart has regular rhythm and rate  Abdomen is soft and non tender with normal bowel sounds  Assessment:  Patient tolerating treatments well  Plan: Continue treatment per original radiation prescription

## 2013-02-01 NOTE — Progress Notes (Addendum)
Patient presents to the clinic today accompanied by her daughter for PUT with Dr. Roselind Messier. Oriented patient to staff and routine of the clinic. Educated patient and her daughter on potential side effects and management such as, skin changes and fatigue. Provided patient with RADIATION THERAPY AND YOU handbook then, reviewed pertinent information. Also, provided patient with radiaplex and alra then, directed upon use.  Patient is alert and oriented to person, place, and time. No distress noted. Patient being pushed in wheelchair due to generalize weakness and fatigue. Pleasant affect noted. Patient reports stabbing right breast, right hip and back pain 5 on a scale of 0-10 for which she take hydrocodone. Patient denies skin changes in treatment field. Patient reports great fatigue. All questions answered. Provided patient with this writer's contact information and encouraged to call with needs. Both patient and her daughter verbalized understanding of all reviewed. Reported all findings to Dr. Roselind Messier.

## 2013-02-02 ENCOUNTER — Ambulatory Visit: Payer: Medicare PPO

## 2013-02-02 ENCOUNTER — Ambulatory Visit
Admission: RE | Admit: 2013-02-02 | Discharge: 2013-02-02 | Disposition: A | Discharge status: Home / Self Care | Payer: Medicare PPO | Source: Ambulatory Visit | Attending: Radiation Oncology | Admitting: Radiation Oncology

## 2013-02-02 ENCOUNTER — Ambulatory Visit (HOSPITAL_BASED_OUTPATIENT_CLINIC_OR_DEPARTMENT_OTHER): Payer: Medicare PPO | Admitting: Oncology

## 2013-02-02 VITALS — BP 149/74 | HR 76 | Temp 97.9°F | Resp 20 | Ht 62.0 in | Wt 207.2 lb

## 2013-02-02 DIAGNOSIS — C50419 Malignant neoplasm of upper-outer quadrant of unspecified female breast: Secondary | ICD-10-CM

## 2013-02-02 DIAGNOSIS — Z170 Estrogen receptor positive status [ER+]: Secondary | ICD-10-CM

## 2013-02-02 DIAGNOSIS — C773 Secondary and unspecified malignant neoplasm of axilla and upper limb lymph nodes: Secondary | ICD-10-CM

## 2013-02-02 DIAGNOSIS — C50411 Malignant neoplasm of upper-outer quadrant of right female breast: Secondary | ICD-10-CM

## 2013-02-02 NOTE — Progress Notes (Signed)
OFFICE PROGRESS NOTE  CC  [name], MD [address] [place] [address] Dr. Lodema Pilot  Dr. Antony Blackbird  DIAGNOSIS: 79 year old female with new diagnosis of invasive ductal carcinoma stage III of the right breast diagnosed December 2013.  STAGE:  Right breast screening 12/20013  S/p Right lumpectomy with SNL  2.7 cm IDC, ER7%/PR-/Her2Neu+ (2.83),  Ki-67 19%%  8/11 Lymph nodes positive for metastatic disease   PRIOR THERAPY: #1 Patient had a routine screening mammogram performed in December 2013. This showed an abnormality in the upper outer quadrant of the right breast. There was also noted to be suspicious lymph nodes in the right axilla. She underwent biopsies of the both areas revealing invasive ductal carcinoma the right breast with metastatic carcinoma in the right axillary lymph node. HER-2/neu was amplified at 2.83. Tumor was partially staining at 7% for estrogen receptor and negative for progesterone receptor. MRI of the breast area revealed 2.7 x 2.4 x 2.2 cm lesion deep within the upper outer quadrant of the right breast. There were also noted to be suspicious level I right axillary lymph nodes. PET scan performed and revealed malignant uptake in the right breast lesion as well as hypermetabolic activity in the right axillary and right retropectoral lymph node areas.   #2Patient subsequently went to partial mastectomy with limited axillary dissection with plans for postoperative radiation therapy. On her final pathology she was found to have a 2.7 cm invasive moderately differentiated mammary carcinoma with mixed ductal and lobular phenotype. Lymphovascular space invasion was noted. 8 of 11 lymph nodes were positive for metastatic disease  #3 was seen by me on 2/3 /2014 for discussion of treatment options. Patient was given option of receiving chemotherapy but she declined. Therefore she was referred to radiation oncology for adjuvant radiation.  She was begun on this and we'll completed in May 2014.   CURRENT THERAPY: Continue radiation therapy  INTERVAL HISTORY: Amber [name] 79 y.o. female returns for followup visit. Thus far she is tolerating radiation well without any problems. She does have some tenderness and redness of the skin. Otherwise she denies any fevers chills night sweats headaches, no myalgias and arthralgias. She has no peripheral paresthesias. Remainder of the 10 point review of systems is negative.  MEDICAL HISTORY: Past Medical History  Diagnosis Date  . Arthritis   . Asthma   . Diabetes mellitus without complication   . GERD (gastroesophageal reflux disease)   . Hyperlipidemia   . Hypertension   . Neuromuscular disorder   . Thyroid disease   . Hearing loss   . Visual disturbance   . Hypothyroidism   . Shortness of breath     occasionally with "excitement"  . Breast cancer     right    ALLERGIES:  has No Known Allergies.  MEDICATIONS:  Current Outpatient Prescriptions  Medication Sig Dispense Refill  . ADVAIR DISKUS 250-50 MCG/DOSE AEPB Inhale 1 puff into the lungs 2 (two) times daily.       Marland Kitchen amLODipine (NORVASC) 5 MG tablet Take 5 mg by mouth Daily.       Marland Kitchen aspirin 81 MG tablet Take 81 mg by mouth daily.      . diazepam (VALIUM) 5 MG tablet Three times daily as needed.      . furosemide (LASIX) 80 MG tablet Take 80 mg by mouth Daily.       Marland Kitchen HYDROcodone-acetaminophen (NORCO/VICODIN) 5-325 MG per tablet Take 1 tablet by mouth every 4 (four)  hours as needed.  40 tablet  0  . non-metallic deodorant (ALRA) MISC Apply 1 application topically daily as needed.      . potassium chloride SA (K-DUR,KLOR-CON) 20 MEQ tablet Take 20 mEq by mouth daily.       . pravastatin (PRAVACHOL) 40 MG tablet Take 40 mg by mouth Daily.       Marland Kitchen PROAIR HFA 108 (90 BASE) MCG/ACT inhaler Inhale 1 puff into the lungs as needed.       . propranolol (INDERAL) 40 MG tablet Take 40 mg by mouth Daily.       Marland Kitchen SYNTHROID 150  MCG tablet Take 150 mcg by mouth Daily.       . Wound Cleansers (RADIAPLEX EX) Apply topically.      . promethazine (PHENERGAN) 25 MG tablet Take 25 mg by mouth every 12 (twelve) hours as needed. Take 1/2 or 1 whole table per dosage as needed.      . traMADol (ULTRAM) 50 MG tablet Every 6 hours as needed.       No current facility-administered medications for this visit.    SURGICAL HISTORY:  Past Surgical History  Procedure Laterality Date  . Back surgery  1980 - approximate  . Carpal tunnel release  unsure of date    both hands  . Rotator cuff repair  unsure of date    not sure which shoulder  . Elbow surgery  unsure of date  . Cervical spine surgery  unsure of date  . Abdominal hysterectomy    . Breast lumpectomy with nedle localization and axillary lymph node dissection  11/22/2012    Procedure: BREAST LUMPECTOMY WITH NEDLE LOCALIZATION AND AXILLARY LYMPH NODE DISSECTION;  Surgeon: Lodema Pilot, DO;  Location: [facility] OR;  Service: General;  Laterality: Right;    REVIEW OF SYSTEMS:  Pertinent items are noted in HPI.   HEALTH MAINTENANCE:   PHYSICAL EXAMINATION: Blood pressure 149/74, pulse 76, temperature 97.9 F (36.6 C), temperature source Oral, resp. rate 20, height 5\' 2"  (1.575 m), weight 207 lb 3.2 oz (93.985 kg). Body mass index is 37.89 kg/(m^2). ECOG PERFORMANCE STATUS: 2 - Symptomatic, <50% confined to bed   General appearance: alert and cooperative Resp: clear to auscultation bilaterally Cardio: regular rate and rhythm GI: soft, non-tender; bowel sounds normal; no masses,  no organomegaly Extremities: edema +1 Neurologic: Grossly normal   LABORATORY DATA: Lab Results  Component Value Date   WBC 5.8 12/06/2012   HGB 13.1 12/06/2012   HCT 39.5 12/06/2012   MCV 91.3 12/06/2012   PLT 184 12/06/2012      Chemistry      Component Value Date/Time   NA 145 12/06/2012 1446   NA 139 11/23/2012 0655   K 3.9 12/06/2012 1446   K 3.5 11/23/2012 0655   CL 104 12/06/2012 1446   CL  102 11/23/2012 0655   CO2 30* 12/06/2012 1446   CO2 30 11/23/2012 0655   BUN 18.9 12/06/2012 1446   BUN 15 11/23/2012 0655   CREATININE 1.1 12/06/2012 1446   CREATININE 1.03 11/23/2012 0655      Component Value Date/Time   CALCIUM 9.3 12/06/2012 1446   CALCIUM 8.6 11/23/2012 0655   ALKPHOS 113 12/06/2012 1446   AST 13 12/06/2012 1446   ALT 11 12/06/2012 1446   BILITOT 0.33 12/06/2012 1446       RADIOGRAPHIC STUDIES:  No results found.  ASSESSMENT: 79 year old female with  #1 2.7 cm invasive ductal carcinoma that is ER +70%  PR negative HER-2/neu positive with a Ki-67 of 19% status post right lumpectomy with a positive lymph node for metastatic disease. Patient declined adjuvant HER-2/chemotherapy. She is receiving radiation therapy tolerating it well. Once she completes this I give her  hormonal therapy consisting of letrozole 2.5 mg daily. She understands the rationale.   PLAN:   #1 continue radiation.  #2 return after completion of radiation in May and we will start her on letrozole 2.5 mg daily.   All questions were answered. The patient knows to call the clinic with any problems, questions or concerns. We can certainly see the patient much sooner if necessary.  I spent 25 minutes counseling the patient face to face. The total time spent in the appointment was 30 minutes.    Drue Second, MD Medical/Oncology [facility] [phone] (beeper) [phone] (Office)  02/02/2013, 12:33 PM

## 2013-02-02 NOTE — Patient Instructions (Addendum)
Continue radiation   I will see you back in May after completion of radiation

## 2013-02-03 ENCOUNTER — Ambulatory Visit: Payer: Medicare PPO | Attending: Radiation Oncology

## 2013-02-03 ENCOUNTER — Ambulatory Visit: Payer: Medicare PPO

## 2013-02-03 ENCOUNTER — Ambulatory Visit
Admission: RE | Admit: 2013-02-03 | Discharge: 2013-02-03 | Disposition: A | Discharge status: Home / Self Care | Payer: Medicare PPO | Source: Ambulatory Visit | Attending: Radiation Oncology | Admitting: Radiation Oncology

## 2013-02-03 DIAGNOSIS — M25619 Stiffness of unspecified shoulder, not elsewhere classified: Secondary | ICD-10-CM | POA: Insufficient documentation

## 2013-02-03 DIAGNOSIS — M79609 Pain in unspecified limb: Secondary | ICD-10-CM | POA: Insufficient documentation

## 2013-02-03 DIAGNOSIS — I890 Lymphedema, not elsewhere classified: Secondary | ICD-10-CM | POA: Insufficient documentation

## 2013-02-03 DIAGNOSIS — IMO0001 Reserved for inherently not codable concepts without codable children: Secondary | ICD-10-CM | POA: Insufficient documentation

## 2013-02-04 ENCOUNTER — Ambulatory Visit: Payer: Medicare PPO

## 2013-02-04 ENCOUNTER — Ambulatory Visit
Admission: RE | Admit: 2013-02-04 | Discharge: 2013-02-04 | Disposition: A | Discharge status: Home / Self Care | Payer: Medicare PPO | Source: Ambulatory Visit | Attending: Radiation Oncology | Admitting: Radiation Oncology

## 2013-02-07 ENCOUNTER — Ambulatory Visit: Payer: Medicare PPO | Admitting: Physical Therapy

## 2013-02-07 ENCOUNTER — Encounter: Payer: Medicare PPO | Admitting: *Deleted

## 2013-02-07 ENCOUNTER — Ambulatory Visit: Payer: Medicare PPO

## 2013-02-07 ENCOUNTER — Ambulatory Visit
Admission: RE | Admit: 2013-02-07 | Discharge: 2013-02-07 | Disposition: A | Discharge status: Home / Self Care | Payer: Medicare PPO | Source: Ambulatory Visit | Attending: Radiation Oncology | Admitting: Radiation Oncology

## 2013-02-08 ENCOUNTER — Ambulatory Visit: Payer: Medicare PPO

## 2013-02-08 ENCOUNTER — Ambulatory Visit
Admission: RE | Admit: 2013-02-08 | Discharge: 2013-02-08 | Disposition: A | Discharge status: Home / Self Care | Payer: Medicare PPO | Source: Ambulatory Visit | Attending: Radiation Oncology | Admitting: Radiation Oncology

## 2013-02-09 ENCOUNTER — Ambulatory Visit
Admission: RE | Admit: 2013-02-09 | Discharge: 2013-02-09 | Disposition: A | Discharge status: Home / Self Care | Payer: Medicare PPO | Source: Ambulatory Visit | Attending: Radiation Oncology | Admitting: Radiation Oncology

## 2013-02-09 ENCOUNTER — Ambulatory Visit: Payer: Medicare PPO | Admitting: Physical Therapy

## 2013-02-09 ENCOUNTER — Ambulatory Visit: Payer: Medicare PPO

## 2013-02-09 VITALS — BP 125/60 | HR 66 | Temp 98.5°F | Ht 62.0 in | Wt 208.0 lb

## 2013-02-09 DIAGNOSIS — C50411 Malignant neoplasm of upper-outer quadrant of right female breast: Secondary | ICD-10-CM

## 2013-02-09 NOTE — Progress Notes (Addendum)
Amber [name] here for weekly under treatment visit accompanied by her daughter.  She is in a wheelchair today.  She has had 10/25 fractions to her right breast.  She has pain that she rates at a 8/10 with her arthritis in her back.  She also has fatigue.  She is reporting loose stools that she has has for the last week.  Her skin has some hyperpigmentation over the right breast.  She is using the radiaplex gel twice a day.

## 2013-02-09 NOTE — Progress Notes (Signed)
  Radiation Oncology         (336) [phone] ________________________________  Name: Amber [name] MRN: [id number]  Date: 02/09/2013  DOB: 10-31-1926  Weekly Radiation Therapy Management  Current Dose: 18 Gy     Planned Dose:  61 Gy  Narrative . . . . . . . . The patient presents for routine under treatment assessment.                                                     The patient is without complaint she denies any itching or discomfort in the breast area.                                 Set-up films were reviewed.                                 The chart was checked. Physical Findings. . .  height is 5\' 2"  (1.575 m) and weight is 208 lb (94.348 kg). Her temperature is 98.5 F (36.9 C). Her blood pressure is 125/60 and her pulse is 66. . Weight essentially stable.  The right breast area shows mild hyperpigmentation changes. The skin is intact. Impression . . . . . . . The patient is  tolerating radiation. Plan . . . . . . . . . . . . Continue treatment as planned.  ________________________________  -----------------------------------  Billie Lade, PhD, MD

## 2013-02-10 ENCOUNTER — Ambulatory Visit
Admission: RE | Admit: 2013-02-10 | Discharge: 2013-02-10 | Disposition: A | Discharge status: Home / Self Care | Payer: Medicare PPO | Source: Ambulatory Visit | Attending: Radiation Oncology | Admitting: Radiation Oncology

## 2013-02-10 ENCOUNTER — Ambulatory Visit: Payer: Medicare PPO

## 2013-02-11 ENCOUNTER — Ambulatory Visit
Admission: RE | Admit: 2013-02-11 | Discharge: 2013-02-11 | Disposition: A | Discharge status: Home / Self Care | Payer: Medicare PPO | Source: Ambulatory Visit | Attending: Radiation Oncology | Admitting: Radiation Oncology

## 2013-02-11 ENCOUNTER — Ambulatory Visit: Payer: Medicare PPO

## 2013-02-11 ENCOUNTER — Encounter: Payer: Self-pay | Admitting: Radiation Oncology

## 2013-02-11 VITALS — BP 127/63 | HR 68 | Temp 97.8°F | Resp 18 | Wt 209.3 lb

## 2013-02-11 DIAGNOSIS — C50411 Malignant neoplasm of upper-outer quadrant of right female breast: Secondary | ICD-10-CM

## 2013-02-11 NOTE — Progress Notes (Signed)
Weekly Management Note Current Dose: 43.2  Gy   Narrative:   The patient asked to be seen today for symptoms of cough and drainage. She has no fever or chills.  Physical Findings: Weight: 209 lb 4.8 oz (94.938 kg). Unchanged  Impression:  The patient is tolerating radiation.  Plan:  Continue treatment as planned. I believe her symptoms are from allergies. I've asked her to contact her primary care physician regarding this. I recommended that she try an allergy medications such as Allegra or Claritin after checking with her primary care physician.

## 2013-02-11 NOTE — Progress Notes (Signed)
Patient presents to the clinic today requesting to see physician for frequent productive cough and inability to rest. Patient is alert and oriented to person, place, and time. No distress noted. Patient being pushed in wheelchair due to generalized weakness. Pleasant affect noted. Patient reports heavy sternal chest pain 2 on a scale of 0-10. No thrush noted. Patient reports a productive cough with thick yellow sputum. Patient denies painful or difficulty swallowing. Patient reports that her throat feels irritated. Patient reports post nasal drip. Patient reports difficulty sleeping related to cough. Patient reports shortness of breath is worse with exertion. Patient reports headache. Reported all findings to Dr. Mitzi Hansen.

## 2013-02-14 ENCOUNTER — Ambulatory Visit
Admission: RE | Admit: 2013-02-14 | Discharge: 2013-02-14 | Disposition: A | Discharge status: Home / Self Care | Payer: Medicare PPO | Source: Ambulatory Visit | Attending: Radiation Oncology | Admitting: Radiation Oncology

## 2013-02-14 ENCOUNTER — Ambulatory Visit: Payer: Medicare PPO

## 2013-02-15 ENCOUNTER — Encounter: Payer: Self-pay | Admitting: Radiation Oncology

## 2013-02-15 ENCOUNTER — Ambulatory Visit: Payer: Medicare PPO

## 2013-02-15 ENCOUNTER — Ambulatory Visit
Admission: RE | Admit: 2013-02-15 | Discharge: 2013-02-15 | Disposition: A | Discharge status: Home / Self Care | Payer: Medicare PPO | Source: Ambulatory Visit | Attending: Radiation Oncology | Admitting: Radiation Oncology

## 2013-02-15 VITALS — BP 147/75 | HR 69 | Temp 97.7°F | Resp 18 | Wt 207.6 lb

## 2013-02-15 DIAGNOSIS — C50411 Malignant neoplasm of upper-outer quadrant of right female breast: Secondary | ICD-10-CM

## 2013-02-15 NOTE — Progress Notes (Signed)
  Radiation Oncology         (336) [phone] ________________________________  Name: SCHERYL SANBORN MRN: [id number]  Date: 02/15/2013  DOB: [birth year]  Weekly Radiation Therapy Management  Current Dose: 25.2 Gy     Planned Dose:  61 Gy  Narrative . . . . . . . . The patient presents for routine under treatment assessment.                                                     The patient is without complaint except for upper respiratory symptoms. She denies any fever or breathing problems. She complains of yellowish drainage.  She will contact her primary care physician if this becomes more issue. This is likely related to the pollen. She denies any itching or discomfort in the right breast or supraclavicular region.                                 Set-up films were reviewed.                                 The chart was checked. Physical Findings. . .  weight is 207 lb 9.6 oz (94.167 kg). Her oral temperature is 97.7 F (36.5 C). Her blood pressure is 147/75 and her pulse is 69. Her respiration is 18 and oxygen saturation is 96%. . Weight essentially stable.  The lungs are clear. The heart has a regular rhythm and rate. The right breast area shows some hyperpigmentation changes but the skin is intact. Impression . . . . . . . The patient is  tolerating radiation. Plan . . . . . . . . . . . . Continue treatment as planned.  ________________________________ Billie Lade, PhD, MD

## 2013-02-15 NOTE — Progress Notes (Signed)
Patient presents to the clinic today accompanied by her sister for PUT with Dr. Roselind Messier. Patient alert and oriented to person, place, and time. No distress noted. Patient being pushed in wheelchair but, able to stand for weight without assistance. Pleasant affect noted. Patient denies pain at this time. Patient denies nipple discharge or bleeding. Mild hyperpigmentation of right/treated breast noted without desquamation. Patient reports using radiaplex bid as directed. Patient reports mild fatigue. Patient reports a worsening productive cough with thick yellow sputum. Patient reports that after she has a coughing spell her chest hurts and she feels short of breath. Encouraged patient to contact Dr. Selena Batten (PCP) office for follow up reference this matter. Patient verbalized understanding. Reported all findings to Dr. Roselind Messier.

## 2013-02-16 ENCOUNTER — Ambulatory Visit: Payer: Medicare PPO

## 2013-02-16 ENCOUNTER — Ambulatory Visit
Admission: RE | Admit: 2013-02-16 | Discharge: 2013-02-16 | Disposition: A | Discharge status: Home / Self Care | Payer: Medicare PPO | Source: Ambulatory Visit | Attending: Radiation Oncology | Admitting: Radiation Oncology

## 2013-02-17 ENCOUNTER — Ambulatory Visit
Admission: RE | Admit: 2013-02-17 | Discharge: 2013-02-17 | Disposition: A | Discharge status: Home / Self Care | Payer: Medicare PPO | Source: Ambulatory Visit | Attending: Radiation Oncology | Admitting: Radiation Oncology

## 2013-02-17 ENCOUNTER — Ambulatory Visit: Payer: Medicare PPO

## 2013-02-18 ENCOUNTER — Ambulatory Visit
Admission: RE | Admit: 2013-02-18 | Discharge: 2013-02-18 | Disposition: A | Discharge status: Home / Self Care | Payer: Medicare PPO | Source: Ambulatory Visit | Attending: Radiation Oncology | Admitting: Radiation Oncology

## 2013-02-21 ENCOUNTER — Ambulatory Visit: Payer: Medicare PPO

## 2013-02-21 ENCOUNTER — Ambulatory Visit
Admission: RE | Admit: 2013-02-21 | Discharge: 2013-02-21 | Disposition: A | Discharge status: Home / Self Care | Payer: Medicare PPO | Source: Ambulatory Visit | Attending: Radiation Oncology | Admitting: Radiation Oncology

## 2013-02-22 ENCOUNTER — Encounter: Payer: Self-pay | Admitting: Radiation Oncology

## 2013-02-22 ENCOUNTER — Ambulatory Visit: Payer: Medicare PPO

## 2013-02-22 ENCOUNTER — Ambulatory Visit
Admission: RE | Admit: 2013-02-22 | Discharge: 2013-02-22 | Disposition: A | Discharge status: Home / Self Care | Payer: Medicare PPO | Source: Ambulatory Visit | Attending: Radiation Oncology | Admitting: Radiation Oncology

## 2013-02-22 VITALS — BP 123/61 | HR 68 | Temp 97.7°F | Wt 200.8 lb

## 2013-02-22 DIAGNOSIS — C50411 Malignant neoplasm of upper-outer quadrant of right female breast: Secondary | ICD-10-CM

## 2013-02-22 MED ORDER — RADIAPLEXRX EX GEL
Freq: Once | CUTANEOUS | Status: AC
  Administered 2013-02-22: 11:00:00 via TOPICAL

## 2013-02-22 NOTE — Addendum Note (Signed)
Encounter addended by: Delynn Flavin, RN on: 02/22/2013 10:42 AM<BR>     Documentation filed: Inpatient MAR

## 2013-02-22 NOTE — Progress Notes (Signed)
  Radiation Oncology         [phone] ________________________________  Name: Amber [name] MRN: [id number]  Date: 02/22/2013  DOB: 07/12/1926  Weekly Radiation Therapy Management  Current Dose: 34.2 Gy     Planned Dose:  61 Gy  Narrative . . . . . . . . The patient presents for routine under treatment assessment.                                                     The patient is without complaint except for occasional itching within the breast area and some fatigue.                                 Set-up films were reviewed.                                 The chart was checked. Physical Findings. . .  weight is 200 lb 12.8 oz (91.082 kg). Her temperature is 97.7 F (36.5 C). Her blood pressure is 123/61 and her pulse is 68. . Weight essentially stable.  No significant changes.  The right breast axillary and supraclavicular region shows hyperpigmentation changes but no skin breakdown. Impression . . . . . . . The patient is  tolerating radiation. Plan . . . . . . . . . . . . Continue treatment as planned.  ________________________________  -----------------------------------  Billie Lade, PhD, MD

## 2013-02-22 NOTE — Progress Notes (Signed)
Ms. Esters has received 19 fractions to her right breast.  She denies any pain.  Note hyperpigmentation to the right breast and right in rammary fold.  Skin in treatment field intact.

## 2013-02-23 ENCOUNTER — Ambulatory Visit: Payer: Medicare PPO

## 2013-02-23 ENCOUNTER — Ambulatory Visit
Admission: RE | Admit: 2013-02-23 | Discharge: 2013-02-23 | Disposition: A | Discharge status: Home / Self Care | Payer: Medicare PPO | Source: Ambulatory Visit | Attending: Radiation Oncology | Admitting: Radiation Oncology

## 2013-02-24 ENCOUNTER — Ambulatory Visit
Admission: RE | Admit: 2013-02-24 | Discharge: 2013-02-24 | Disposition: A | Discharge status: Home / Self Care | Payer: Medicare PPO | Source: Ambulatory Visit | Attending: Radiation Oncology | Admitting: Radiation Oncology

## 2013-02-25 ENCOUNTER — Ambulatory Visit: Payer: Medicare PPO

## 2013-02-25 ENCOUNTER — Ambulatory Visit
Admission: RE | Admit: 2013-02-25 | Discharge: 2013-02-25 | Disposition: A | Discharge status: Home / Self Care | Payer: Medicare PPO | Source: Ambulatory Visit | Attending: Radiation Oncology | Admitting: Radiation Oncology

## 2013-02-27 ENCOUNTER — Ambulatory Visit: Payer: Medicare PPO

## 2013-02-28 ENCOUNTER — Ambulatory Visit: Payer: Medicare PPO

## 2013-02-28 ENCOUNTER — Ambulatory Visit
Admission: RE | Admit: 2013-02-28 | Discharge: 2013-02-28 | Disposition: A | Discharge status: Home / Self Care | Payer: Medicare PPO | Source: Ambulatory Visit | Attending: Radiation Oncology | Admitting: Radiation Oncology

## 2013-02-28 ENCOUNTER — Encounter: Payer: Self-pay | Admitting: Radiation Oncology

## 2013-02-28 NOTE — Progress Notes (Signed)
   Department of Radiation Oncology  Phone:  [phone] Fax:        [phone]  Simulation note  Today the patient underwent additional planning for radiation therapy directed at the right breast area. The patient's treatment planning CT scan was reviewed and she had set up of a 3 field photon boost directed at the lumpectomy cavity. A computerized isodose plan will be generated for treatment. The patient will receive 8 additional treatments for an additional dose of 16 gray.  -----------------------------------  Billie Lade, PhD, MD

## 2013-03-01 ENCOUNTER — Encounter: Payer: Self-pay | Admitting: Radiation Oncology

## 2013-03-01 ENCOUNTER — Ambulatory Visit: Payer: Medicare PPO

## 2013-03-01 ENCOUNTER — Ambulatory Visit
Admission: RE | Admit: 2013-03-01 | Discharge: 2013-03-01 | Disposition: A | Discharge status: Home / Self Care | Payer: Medicare PPO | Source: Ambulatory Visit | Attending: Radiation Oncology | Admitting: Radiation Oncology

## 2013-03-01 VITALS — BP 126/78 | HR 66 | Temp 97.8°F | Wt 203.0 lb

## 2013-03-01 DIAGNOSIS — C50411 Malignant neoplasm of upper-outer quadrant of right female breast: Secondary | ICD-10-CM

## 2013-03-01 NOTE — Progress Notes (Signed)
Amber [name] has received 24 fractions to her right breast.  Note hyperpigmentation on the anterior breast and the inframmary fold and her skin is intact.  She admits to fatigue and travels by wheelchair.

## 2013-03-01 NOTE — Progress Notes (Signed)
  Radiation Oncology         [phone] ________________________________  Name: Amber [name] MRN: [id number]  Date: 03/01/2013  DOB: 15-Aug-1926  Weekly Radiation Therapy Management  Current Dose: 43.2 Gy     Planned Dose:  61 Gy  Narrative . . . . . . . . The patient presents for routine under treatment assessment.                                                     The patient is without complaint.  She has mild fatigue but denies any itching or discomfort in the breast area.                                 Set-up films were reviewed.                                 The chart was checked. Physical Findings. . .  weight is 203 lb (92.08 kg). Her temperature is 97.8 F (36.6 C). Her blood pressure is 126/78 and her pulse is 66. . Weight essentially stable.  The right breast area shows hyperpigmentation changes but no skin breakdown is appreciated. Impression . . . . . . . The patient is  tolerating radiation. Plan . . . . . . . . . . . . Continue treatment as planned.  ________________________________  -----------------------------------  Billie Lade, PhD, MD

## 2013-03-02 ENCOUNTER — Ambulatory Visit
Admission: RE | Admit: 2013-03-02 | Discharge: 2013-03-02 | Disposition: A | Discharge status: Home / Self Care | Payer: Medicare PPO | Source: Ambulatory Visit | Attending: Radiation Oncology | Admitting: Radiation Oncology

## 2013-03-02 ENCOUNTER — Ambulatory Visit: Payer: Medicare PPO

## 2013-03-03 ENCOUNTER — Ambulatory Visit: Payer: Medicare PPO

## 2013-03-03 ENCOUNTER — Ambulatory Visit
Admission: RE | Admit: 2013-03-03 | Discharge: 2013-03-03 | Disposition: A | Discharge status: Home / Self Care | Payer: Medicare PPO | Source: Ambulatory Visit | Attending: Radiation Oncology | Admitting: Radiation Oncology

## 2013-03-04 ENCOUNTER — Ambulatory Visit
Admission: RE | Admit: 2013-03-04 | Discharge: 2013-03-04 | Disposition: A | Discharge status: Home / Self Care | Payer: Medicare PPO | Source: Ambulatory Visit | Attending: Radiation Oncology | Admitting: Radiation Oncology

## 2013-03-04 ENCOUNTER — Ambulatory Visit: Payer: Medicare PPO

## 2013-03-07 ENCOUNTER — Ambulatory Visit
Admission: RE | Admit: 2013-03-07 | Discharge: 2013-03-07 | Disposition: A | Discharge status: Home / Self Care | Payer: Medicare PPO | Source: Ambulatory Visit | Attending: Radiation Oncology | Admitting: Radiation Oncology

## 2013-03-07 ENCOUNTER — Ambulatory Visit: Payer: Medicare PPO

## 2013-03-08 ENCOUNTER — Ambulatory Visit
Admission: RE | Admit: 2013-03-08 | Discharge: 2013-03-08 | Disposition: A | Discharge status: Home / Self Care | Payer: Medicare PPO | Source: Ambulatory Visit | Attending: Radiation Oncology | Admitting: Radiation Oncology

## 2013-03-08 ENCOUNTER — Encounter: Payer: Self-pay | Admitting: Radiation Oncology

## 2013-03-08 VITALS — BP 107/69 | HR 68 | Temp 98.4°F | Resp 20 | Wt 208.2 lb

## 2013-03-08 DIAGNOSIS — C50411 Malignant neoplasm of upper-outer quadrant of right female breast: Secondary | ICD-10-CM

## 2013-03-08 NOTE — Progress Notes (Signed)
Pt denies pain but does states she has some discomfort in her neck today. She reports fatigue, no loss of appetite. She is applying Radiaplex to right breast treatment area for hyperpigmentation. Pt states when she bathes "there is a little skin in the water".

## 2013-03-08 NOTE — Progress Notes (Signed)
[facility]    Radiation Oncology [address] [place]     Maryln Gottron, M.D. [place], [place] [address]               Billie Lade, M.D., Ph.D. Phone: [phone]      Molli Hazard A. Kathrynn Running, M.D. Fax: [phone]      Radene Gunning, M.D., Ph.D.         Lurline Hare, M.D.         Grayland Jack, M.D Weekly Treatment Management Note  Name: Amber [name]     MRN: [id number]        CSN: [id number] Date: 03/08/2013      DOB: [birth year]  CC: [name], MD         Juleen China    Status: Outpatient  Diagnosis: The encounter diagnosis was Cancer of upper-outer quadrant of female breast, right.  Current Dose: 53 Gy  Current Fraction: 29  Planned Dose: 61 Gy  Narrative: Perlie Gold was seen today for weekly treatment management. The chart was checked and port films  were reviewed. She is having some fatigue but otherwise is doing well up for treatments. She denies any significant itching or discomfort in the breast area.  Review of patient's allergies indicates no known allergies. Current Outpatient Prescriptions  Medication Sig Dispense Refill  . ADVAIR DISKUS 250-50 MCG/DOSE AEPB Inhale 1 puff into the lungs 2 (two) times daily.       Marland Kitchen amLODipine (NORVASC) 5 MG tablet Take 5 mg by mouth Daily.       Marland Kitchen aspirin 81 MG tablet Take 81 mg by mouth daily.      . diazepam (VALIUM) 5 MG tablet Three times daily as needed.      . furosemide (LASIX) 80 MG tablet Take 80 mg by mouth Daily.       Marland Kitchen HYDROcodone-acetaminophen (NORCO/VICODIN) 5-325 MG per tablet Take 1 tablet by mouth every 4 (four) hours as needed.  40 tablet  0  . non-metallic deodorant (ALRA) MISC Apply 1 application topically daily as needed.      . potassium chloride SA (K-DUR,KLOR-CON) 20 MEQ tablet Take 20 mEq by mouth daily.       . pravastatin (PRAVACHOL) 40 MG tablet Take 40 mg by mouth Daily.       Marland Kitchen PROAIR HFA 108 (90 BASE) MCG/ACT inhaler Inhale 1 puff into the lungs as needed.       .  promethazine (PHENERGAN) 25 MG tablet Take 25 mg by mouth every 12 (twelve) hours as needed. Take 1/2 or 1 whole table per dosage as needed.      . propranolol (INDERAL) 40 MG tablet Take 40 mg by mouth Daily.       Marland Kitchen SYNTHROID 150 MCG tablet Take 150 mcg by mouth Daily.       . traMADol (ULTRAM) 50 MG tablet Every 6 hours as needed.      . Wound Cleansers (RADIAPLEX EX) Apply topically.       No current facility-administered medications for this encounter.   Labs:    Physical Examination:  weight is 208 lb 3.2 oz (94.439 kg). Her oral temperature is 98.4 F (36.9 C). Her blood pressure is 107/69 and her pulse is 68. Her respiration is 20.    Wt Readings from Last 3 Encounters:  03/08/13 208 lb 3.2 oz (94.439 kg)  03/01/13 203 lb (92.08 kg)  02/22/13 200 lb 12.8 oz (91.082 kg)  The right breast area shows hyperpigmentation changes without any skin breakdown.  She also has hyperpigmentation changes in the right supraclavicular fossa but no skin breakdown is appreciated.  She is sensitive to touch in this area. Lungs - Normal respiratory effort, chest expands symmetrically. Lungs are clear to auscultation, no crackles or wheezes.  Heart has regular rhythm and rate  Abdomen is soft and non tender with normal bowel sounds  Assessment:  Patient tolerating treatments well  Plan: Continue treatment per original radiation prescription

## 2013-03-09 ENCOUNTER — Ambulatory Visit: Payer: Medicare PPO

## 2013-03-09 ENCOUNTER — Ambulatory Visit
Admission: RE | Admit: 2013-03-09 | Discharge: 2013-03-09 | Disposition: A | Discharge status: Home / Self Care | Payer: Medicare PPO | Source: Ambulatory Visit | Attending: Radiation Oncology | Admitting: Radiation Oncology

## 2013-03-10 ENCOUNTER — Ambulatory Visit: Payer: Medicare PPO

## 2013-03-10 ENCOUNTER — Ambulatory Visit
Admission: RE | Admit: 2013-03-10 | Discharge: 2013-03-10 | Disposition: A | Discharge status: Home / Self Care | Payer: Medicare PPO | Source: Ambulatory Visit | Attending: Radiation Oncology | Admitting: Radiation Oncology

## 2013-03-10 ENCOUNTER — Telehealth: Payer: Self-pay | Admitting: Oncology

## 2013-03-11 ENCOUNTER — Ambulatory Visit: Payer: Medicare PPO

## 2013-03-11 ENCOUNTER — Ambulatory Visit
Admission: RE | Admit: 2013-03-11 | Discharge: 2013-03-11 | Disposition: A | Discharge status: Home / Self Care | Payer: Medicare PPO | Source: Ambulatory Visit | Attending: Radiation Oncology | Admitting: Radiation Oncology

## 2013-03-14 ENCOUNTER — Ambulatory Visit: Payer: Medicare PPO

## 2013-03-14 ENCOUNTER — Ambulatory Visit
Admission: RE | Admit: 2013-03-14 | Discharge: 2013-03-14 | Disposition: A | Discharge status: Home / Self Care | Payer: Medicare PPO | Source: Ambulatory Visit | Attending: Radiation Oncology | Admitting: Radiation Oncology

## 2013-03-14 ENCOUNTER — Encounter: Payer: Self-pay | Admitting: Radiation Oncology

## 2013-03-14 VITALS — BP 123/57 | HR 69 | Temp 98.2°F | Resp 20 | Wt 206.6 lb

## 2013-03-14 DIAGNOSIS — C50411 Malignant neoplasm of upper-outer quadrant of right female breast: Secondary | ICD-10-CM

## 2013-03-14 MED ORDER — RADIAPLEXRX EX GEL
Freq: Once | CUTANEOUS | Status: AC
  Administered 2013-03-14: 10:00:00 via TOPICAL

## 2013-03-14 NOTE — Progress Notes (Signed)
  Radiation Oncology         [phone] ________________________________  Name: Amber GUERRERA MRN: [id number]  Date: 03/14/2013  DOB: Mar 08, 1926  Weekly Radiation Therapy Management  Current Dose: 61 Gy     Planned Dose:  61 Gy  Narrative . . . . . . . . The patient presents for routine under treatment assessment.                                                     The patient is without complaint except soreness in the supraclavicular region area she has mild fatigue in addition.                                 Set-up films were reviewed.                                 The chart was checked. Physical Findings. . .  weight is 206 lb 9.6 oz (93.713 kg). Her oral temperature is 98.2 F (36.8 C). Her blood pressure is 123/57 and her pulse is 69. Her respiration is 20. . Weight essentially stable.  No significant changes. Hyperpigmentation changes in the breast and supraclavicular region but no moist desquamation. Impression . . . . . . . The patient is  tolerating radiation. Plan . . . . . . . . . . . Marland Kitchen routine followup in one month  ________________________________  -----------------------------------  Billie Lade, PhD, MD

## 2013-03-14 NOTE — Progress Notes (Signed)
Pt completed treatment today. She c/o pain, skin irritation in right neck/supraclavicular area. She has been applying Radiaplex lotion. She has some dry desquamation in this area. She states she has "a small open area under her breast, but it doesn't hurt". Pt denies loss of appetite, is fatigued. Gave pt another tube of Radiaplex and 1 month FU appt.

## 2013-03-21 ENCOUNTER — Ambulatory Visit: Payer: Medicare PPO | Admitting: Oncology

## 2013-03-21 ENCOUNTER — Other Ambulatory Visit: Payer: Medicare PPO | Admitting: Lab

## 2013-03-31 ENCOUNTER — Encounter: Payer: Self-pay | Admitting: Radiation Oncology

## 2013-03-31 NOTE — Progress Notes (Signed)
  Radiation Oncology         [phone] ________________________________  Name: Amber [name] MRN: [id number]  Date: 03/02/13  DOB: [birth year]  Simulation Verification Note  Status: outpatient  NARRATIVE: The patient was brought to the treatment unit and placed in the planned treatment position. The clinical setup was verified. Then port films were obtained and uploaded to the radiation oncology medical record software.  The treatment beams were carefully compared against the planned radiation fields. The position location and shape of the radiation fields was reviewed. They targeted volume of tissue appears to be appropriately covered by the radiation beams. Organs at risk appear to be excluded as planned.  Based on my personal review, I approved the simulation verification. The patient's treatment will proceed as planned.  -----------------------------------  Billie Lade, PhD, MD

## 2013-03-31 NOTE — Progress Notes (Signed)
  Radiation Oncology         [phone] ________________________________  Name: Amber [name] MRN: [id number]  Date: 03/31/2013  DOB: 12-Jan-1926  End of Treatment Note  Diagnosis:    T2 N2 right breast cancer    Indication for treatment:  Breast conservation and local regional nodal coverage       Radiation treatment dates:   01/27/2013 through 03/14/2013  Site/dose:   Right breast axillary and supraclavicular region 45 gray in 25 fractions, the site of presentation in the upper outer aspect of the breast was boosted to a cumulative dose of 61 gray  Beams/energy:   Initial set up was 4 fields with tangential beams encompassing the right breast. The supraclavicular and axillary area was treated with a left anterior oblique field and the midaxilla was boosted with a PA field. The boost field directed at the lumpectomy bed with a 3 field photon set up  Narrative: The patient tolerated radiation treatment relatively well.   She had some fatigue as well as some itching and discomfort in the treatment area  Plan: The patient has completed radiation treatment. The patient will return to radiation oncology clinic for routine followup in one month. I advised them to call or return sooner if they have any questions or concerns related to their recovery or treatment.  -----------------------------------  Billie Lade, PhD, MD

## 2013-04-01 ENCOUNTER — Other Ambulatory Visit: Payer: Self-pay | Admitting: Emergency Medicine

## 2013-04-01 ENCOUNTER — Other Ambulatory Visit (HOSPITAL_BASED_OUTPATIENT_CLINIC_OR_DEPARTMENT_OTHER): Payer: Medicare PPO | Admitting: Lab

## 2013-04-01 ENCOUNTER — Telehealth: Payer: Self-pay | Admitting: Oncology

## 2013-04-01 ENCOUNTER — Ambulatory Visit (HOSPITAL_BASED_OUTPATIENT_CLINIC_OR_DEPARTMENT_OTHER): Payer: Medicare PPO | Admitting: Oncology

## 2013-04-01 ENCOUNTER — Encounter: Payer: Self-pay | Admitting: Oncology

## 2013-04-01 VITALS — BP 144/78 | HR 79 | Temp 98.6°F | Resp 20 | Ht 62.0 in | Wt 206.7 lb

## 2013-04-01 DIAGNOSIS — C50419 Malignant neoplasm of upper-outer quadrant of unspecified female breast: Secondary | ICD-10-CM

## 2013-04-01 DIAGNOSIS — C773 Secondary and unspecified malignant neoplasm of axilla and upper limb lymph nodes: Secondary | ICD-10-CM

## 2013-04-01 DIAGNOSIS — C50411 Malignant neoplasm of upper-outer quadrant of right female breast: Secondary | ICD-10-CM

## 2013-04-01 DIAGNOSIS — Z9010 Acquired absence of unspecified breast and nipple: Secondary | ICD-10-CM

## 2013-04-01 LAB — CBC WITH DIFFERENTIAL/PLATELET
Basophils Absolute: 0 10*3/uL (ref 0.0–0.1)
Eosinophils Absolute: 0.1 10*3/uL (ref 0.0–0.5)
HCT: 42.9 % (ref 34.8–46.6)
HGB: 14.4 g/dL (ref 11.6–15.9)
LYMPH%: 16.4 % (ref 14.0–49.7)
MCHC: 33.5 g/dL (ref 31.5–36.0)
MONO#: 0.4 10*3/uL (ref 0.1–0.9)
NEUT#: 2.5 10*3/uL (ref 1.5–6.5)
NEUT%: 70.4 % (ref 38.4–76.8)
Platelets: 144 10*3/uL — ABNORMAL LOW (ref 145–400)
WBC: 3.5 10*3/uL — ABNORMAL LOW (ref 3.9–10.3)
lymph#: 0.6 10*3/uL — ABNORMAL LOW (ref 0.9–3.3)

## 2013-04-01 LAB — COMPREHENSIVE METABOLIC PANEL (CC13)
ALT: 12 U/L (ref 0–55)
Albumin: 3.1 g/dL — ABNORMAL LOW (ref 3.5–5.0)
BUN: 17 mg/dL (ref 7.0–26.0)
CO2: 33 mEq/L — ABNORMAL HIGH (ref 22–29)
Chloride: 103 mEq/L (ref 98–107)
Creatinine: 1 mg/dL (ref 0.6–1.1)
Glucose: 128 mg/dl — ABNORMAL HIGH (ref 70–99)
Total Bilirubin: 0.46 mg/dL (ref 0.20–1.20)

## 2013-04-01 MED ORDER — LETROZOLE 2.5 MG PO TABS: 2.5000 mg | ORAL_TABLET | Freq: Every day | ORAL | Status: DC

## 2013-04-01 MED ORDER — VITAMIN D 1000 UNITS PO TABS: 1000.0000 [IU] | ORAL_TABLET | Freq: Every day | ORAL | Status: DC

## 2013-04-01 NOTE — Telephone Encounter (Signed)
, °

## 2013-04-01 NOTE — Patient Instructions (Addendum)
Begin letrozole 1 mg daily  Take calcium anLetrozole tablets What is this medicine? LETROZOLE (LET roe zole) blocks the production of estrogen. Certain types of breast cancer grow under the influence of estrogen. Letrozole helps block tumor growth. This medicine is used to treat advanced breast cancer in postmenopausal women. This medicine may be used for other purposes; ask your health care provider or pharmacist if you have questions. What should I tell my health care provider before I take this medicine? They need to know if you have any of these conditions: -liver disease -osteoporosis (weak bones) -an unusual or allergic reaction to letrozole, other medicines, foods, dyes, or preservatives -pregnant or trying to get pregnant -breast-feeding How should I use this medicine? Take this medicine by mouth with a glass of water. You may take it with or without food. Follow the directions on the prescription label. Take your medicine at regular intervals. Do not take your medicine more often than directed. Do not stop taking except on your doctor's advice. Talk to your pediatrician regarding the use of this medicine in children. Special care may be needed. Overdosage: If you think you have taken too much of this medicine contact a poison control center or emergency room at once. NOTE: This medicine is only for you. Do not share this medicine with others. What if I miss a dose? If you miss a dose, take it as soon as you can. If it is almost time for your next dose, take only that dose. Do not take double or extra doses. What may interact with this medicine? Do not take this medicine with any of the following medications: -estrogens, like hormone replacement therapy or birth control pills This medicine may also interact with the following medications: -dietary supplements such as androstenedione or DHEA -prasterone -tamoxifen This list may not describe all possible interactions. Give your  health care provider a list of all the medicines, herbs, non-prescription drugs, or dietary supplements you use. Also tell them if you smoke, drink alcohol, or use illegal drugs. Some items may interact with your medicine. What should I watch for while using this medicine? Visit your doctor or health care professional for regular check-ups to monitor your condition. Do not use this drug if you are pregnant. Serious side effects to an unborn child are possible. Talk to your doctor or pharmacist for more information. You may get drowsy or dizzy. Do not drive, use machinery, or do anything that needs mental alertness until you know how this medicine affects you. Do not stand or sit up quickly, especially if you are an older patient. This reduces the risk of dizzy or fainting spells. What side effects may I notice from receiving this medicine? Side effects that you should report to your doctor or health care professional as soon as possible: -allergic reactions like skin rash, itching, or hives -bone fracture -chest pain -difficulty breathing or shortness of breath -severe pain, swelling, warmth in the leg -unusually weak or tired -vaginal bleeding Side effects that usually do not require medical attention (report to your doctor or health care professional if they continue or are bothersome): -bone, back, joint, or muscle pain -dizziness -fatigue -fluid retention -headache -hot flashes, night sweats -nausea -weight gain This list may not describe all possible side effects. Call your doctor for medical advice about side effects. You may report side effects to FDA at 1-800-FDA-1088. Where should I keep my medicine? Keep out of the reach of children. Store between 15 and 30 degrees  C (59 and 86 degrees F). Throw away any unused medicine after the expiration date. NOTE: This sheet is a summary. It may not cover all possible information. If you have questions about this medicine, talk to your  doctor, pharmacist, or health care provider.  2013, [employer]vier/Gold Standard. (12/31/2007 4:43:44 PM) d vitamin D as instructed  I will see you back in 3 months for follow up

## 2013-04-13 ENCOUNTER — Encounter: Payer: Self-pay | Admitting: Radiation Oncology

## 2013-04-13 DIAGNOSIS — Z923 Personal history of irradiation: Secondary | ICD-10-CM | POA: Insufficient documentation

## 2013-04-14 ENCOUNTER — Encounter: Payer: Self-pay | Admitting: Radiation Oncology

## 2013-04-14 ENCOUNTER — Ambulatory Visit
Admission: RE | Admit: 2013-04-14 | Discharge: 2013-04-14 | Disposition: A | Discharge status: Home / Self Care | Payer: Medicare PPO | Source: Ambulatory Visit | Attending: Radiation Oncology | Admitting: Radiation Oncology

## 2013-04-14 VITALS — BP 117/76 | HR 74 | Temp 98.6°F | Resp 20 | Wt 204.6 lb

## 2013-04-14 DIAGNOSIS — C50411 Malignant neoplasm of upper-outer quadrant of right female breast: Secondary | ICD-10-CM

## 2013-04-14 NOTE — Progress Notes (Signed)
Pt c/o hot flashes, nausea since beginning Femara. She denies problems w/right breast but states she still has discomfort in right neck region from skin irritation. She is applying lotion to skin of right neck/breast treatment area. She has darkening of skin in right neck. She denies other problems. Pt requesting refill on Phenergan for nausea she is experiencing.

## 2013-04-14 NOTE — Progress Notes (Signed)
Radiation Oncology         [phone] ________________________________  Name: Amber [name] MRN: [id number]  Date: 04/14/2013  DOB: Mar 14, 1926  Follow-Up Visit Note  CC: [name], MD  Victorino December, MD  Diagnosis:   Right breast cancer  Interval Since Last Radiation:  1  months  Narrative:  The patient returns today for routine follow-up.  She is doing recently well. She denies any significant itching or discomfort along the right breast or axillary region. She continues to have some sensitivity along the supraclavicular area. Patient has had some hot flashes since starting Femara. She is also had some nausea and diarrhea which may be related to a stomach virus. She denies any cough or breathing problems.                              ALLERGIES:  has No Known Allergies.  Meds: Current Outpatient Prescriptions  Medication Sig Dispense Refill  . ADVAIR DISKUS 250-50 MCG/DOSE AEPB Inhale 1 puff into the lungs 2 (two) times daily.       Marland Kitchen amLODipine (NORVASC) 5 MG tablet Take 5 mg by mouth Daily.       Marland Kitchen aspirin 81 MG tablet Take 81 mg by mouth daily.      . cholecalciferol (VITAMIN D) 1000 UNITS tablet Take 1 tablet (1,000 Units total) by mouth daily.  90 tablet  12  . diazepam (VALIUM) 5 MG tablet Three times daily as needed.      . furosemide (LASIX) 80 MG tablet Take 80 mg by mouth Daily.       Marland Kitchen HYDROcodone-acetaminophen (NORCO/VICODIN) 5-325 MG per tablet Take 1 tablet by mouth every 4 (four) hours as needed.  40 tablet  0  . letrozole (FEMARA) 2.5 MG tablet Take 1 tablet (2.5 mg total) by mouth daily.  90 tablet  12  . potassium chloride SA (K-DUR,KLOR-CON) 20 MEQ tablet Take 20 mEq by mouth daily.       . pravastatin (PRAVACHOL) 40 MG tablet Take 40 mg by mouth Daily.       Marland Kitchen PROAIR HFA 108 (90 BASE) MCG/ACT inhaler Inhale 1 puff into the lungs as needed.       . promethazine (PHENERGAN) 25 MG tablet Take 25 mg by mouth every 12 (twelve) hours as needed. Take  1/2 or 1 whole table per dosage as needed.      . propranolol (INDERAL) 40 MG tablet Take 40 mg by mouth Daily.       Marland Kitchen SYNTHROID 150 MCG tablet Take 150 mcg by mouth Daily.       . traMADol (ULTRAM) 50 MG tablet Every 6 hours as needed.       No current facility-administered medications for this encounter.    Physical Findings: The patient is in no acute distress. Patient is alert and oriented.  weight is 204 lb 9.6 oz (92.806 kg). Her oral temperature is 98.6 F (37 C). Her blood pressure is 117/76 and her pulse is 74. Her respiration is 20. Marland Kitchen  No palpable supraclavicular or axillary adenopathy. The lungs are clear to auscultation. The heart has a regular rhythm and rate. Examination of the right breast area reveals hyperpigmentation changes. Patient's skin is well healed. There is induration noted at the lumpectomy scar but no dominant masses appreciated. The patient has some sensitivity with palpation in the surgical bed.  Lab Findings: Lab Results  Component Value Date  WBC 3.5* 04/01/2013   HGB 14.4 04/01/2013   HCT 42.9 04/01/2013   MCV 90.6 04/01/2013   PLT 144* 04/01/2013      Radiographic Findings: No results found.  Impression:  The patient is recovering from the effects of radiation.  No evidence of recurrence on clinical exam today  Plan:  When necessary followup in radiation oncology. The patient will continue close followup in medical oncology.  _____________________________________ -----------------------------------  Billie Lade, PhD, MD

## 2013-04-24 NOTE — Progress Notes (Signed)
OFFICE PROGRESS NOTE  CC  [name], MD [address] [place] [address] Dr. Lodema Pilot  Dr. Antony Blackbird  DIAGNOSIS: 79 year old female with new diagnosis of invasive ductal carcinoma stage III of the right breast diagnosed December 2013.  STAGE:  Right breast screening 12/20013  S/p Right lumpectomy with SNL  2.7 cm IDC, ER7%/PR-/Her2Neu+ (2.83),  Ki-67 19%%  8/11 Lymph nodes positive for metastatic disease   PRIOR THERAPY: #1 Patient had a routine screening mammogram performed in December 2013. This showed an abnormality in the upper outer quadrant of the right breast. There was also noted to be suspicious lymph nodes in the right axilla. She underwent biopsies of the both areas revealing invasive ductal carcinoma the right breast with metastatic carcinoma in the right axillary lymph node. HER-2/neu was amplified at 2.83. Tumor was partially staining at 7% for estrogen receptor and negative for progesterone receptor. MRI of the breast area revealed 2.7 x 2.4 x 2.2 cm lesion deep within the upper outer quadrant of the right breast. There were also noted to be suspicious level I right axillary lymph nodes. PET scan performed and revealed malignant uptake in the right breast lesion as well as hypermetabolic activity in the right axillary and right retropectoral lymph node areas.   #2Patient subsequently went to partial mastectomy with limited axillary dissection with plans for postoperative radiation therapy. On her final pathology she was found to have a 2.7 cm invasive moderately differentiated mammary carcinoma with mixed ductal and lobular phenotype. Lymphovascular space invasion was noted. 8 of 11 lymph nodes were positive for metastatic disease  #3 was seen by me on 2/3 /2014 for discussion of treatment options. Patient was given option of receiving chemotherapy but she declined. Therefore she was referred to radiation oncology for adjuvant radiation.  She was begun on this and we'll completed in May 2014.  #4 began letrozole 2.5 mg daily starting 04/01/13. Total of 5 years of therapy is planned   CURRENT THERAPY: letrozole 2.5 mg daily  INTERVAL HISTORY: Amber [name] 79 y.o. female returns for followup visit. T she is tolerated radiation well without any problems. She does have some tenderness and redness of the skin. Otherwise she denies any fevers chills night sweats headaches, no myalgias and arthralgias. She has no peripheral paresthesias. Remainder of the 10 point review of systems is negative.  MEDICAL HISTORY: Past Medical History  Diagnosis Date  . Arthritis   . Asthma   . Diabetes mellitus without complication   . GERD (gastroesophageal reflux disease)   . Hyperlipidemia   . Hypertension   . Neuromuscular disorder   . Thyroid disease   . Hearing loss   . Visual disturbance   . Hypothyroidism   . Shortness of breath     occasionally with "excitement"  . Breast cancer 2013    right  . Hx of radiation therapy 01/27/13- 03/14/13    right breast axillary/supraclavicular region 45 Gy in 25 fx, upper outer breast cummulative dose of 61 Gy    ALLERGIES:  has No Known Allergies.  MEDICATIONS:  Current Outpatient Prescriptions  Medication Sig Dispense Refill  . ADVAIR DISKUS 250-50 MCG/DOSE AEPB Inhale 1 puff into the lungs 2 (two) times daily.       Marland Kitchen amLODipine (NORVASC) 5 MG tablet Take 5 mg by mouth Daily.       Marland Kitchen aspirin 81 MG tablet Take 81 mg by mouth daily.      . diazepam (VALIUM) 5  MG tablet Three times daily as needed.      . furosemide (LASIX) 80 MG tablet Take 80 mg by mouth Daily.       Marland Kitchen HYDROcodone-acetaminophen (NORCO/VICODIN) 5-325 MG per tablet Take 1 tablet by mouth every 4 (four) hours as needed.  40 tablet  0  . potassium chloride SA (K-DUR,KLOR-CON) 20 MEQ tablet Take 20 mEq by mouth daily.       . pravastatin (PRAVACHOL) 40 MG tablet Take 40 mg by mouth Daily.       Marland Kitchen PROAIR HFA 108 (90 BASE)  MCG/ACT inhaler Inhale 1 puff into the lungs as needed.       . promethazine (PHENERGAN) 25 MG tablet Take 25 mg by mouth every 12 (twelve) hours as needed. Take 1/2 or 1 whole table per dosage as needed.      . propranolol (INDERAL) 40 MG tablet Take 40 mg by mouth Daily.       Marland Kitchen SYNTHROID 150 MCG tablet Take 150 mcg by mouth Daily.       . traMADol (ULTRAM) 50 MG tablet Every 6 hours as needed.      . cholecalciferol (VITAMIN D) 1000 UNITS tablet Take 1 tablet (1,000 Units total) by mouth daily.  90 tablet  12  . letrozole (FEMARA) 2.5 MG tablet Take 1 tablet (2.5 mg total) by mouth daily.  90 tablet  12   No current facility-administered medications for this visit.    SURGICAL HISTORY:  Past Surgical History  Procedure Laterality Date  . Back surgery  1980 - approximate  . Carpal tunnel release  unsure of date    both hands  . Rotator cuff repair  unsure of date    not sure which shoulder  . Elbow surgery  unsure of date  . Cervical spine surgery  unsure of date  . Abdominal hysterectomy    . Breast lumpectomy with nedle localization and axillary lymph node dissection  11/22/2012    Procedure: BREAST LUMPECTOMY WITH NEDLE LOCALIZATION AND AXILLARY LYMPH NODE DISSECTION;  Surgeon: Lodema Pilot, DO;  Location: [facility] OR;  Service: General;  Laterality: Right;    REVIEW OF SYSTEMS:  Pertinent items are noted in HPI.   HEALTH MAINTENANCE:   PHYSICAL EXAMINATION: Blood pressure 144/78, pulse 79, temperature 98.6 F (37 C), temperature source Oral, resp. rate 20, height 5\' 2"  (1.575 m), weight 206 lb 11.2 oz (93.759 kg). Body mass index is 37.8 kg/(m^2). ECOG PERFORMANCE STATUS: 2 - Symptomatic, <50% confined to bed   General appearance: alert and cooperative Resp: clear to auscultation bilaterally Cardio: regular rate and rhythm GI: soft, non-tender; bowel sounds normal; no masses,  no organomegaly Extremities: edema +1 Neurologic: Grossly normal   LABORATORY DATA: Lab Results   Component Value Date   WBC 3.5* 04/01/2013   HGB 14.4 04/01/2013   HCT 42.9 04/01/2013   MCV 90.6 04/01/2013   PLT 144* 04/01/2013      Chemistry      Component Value Date/Time   NA 144 04/01/2013 1039   NA 139 11/23/2012 0655   K 3.4* 04/01/2013 1039   K 3.5 11/23/2012 0655   CL 103 04/01/2013 1039   CL 102 11/23/2012 0655   CO2 33* 04/01/2013 1039   CO2 30 11/23/2012 0655   BUN 17.0 04/01/2013 1039   BUN 15 11/23/2012 0655   CREATININE 1.0 04/01/2013 1039   CREATININE 1.03 11/23/2012 0655      Component Value Date/Time   CALCIUM 9.6 04/01/2013  1039   CALCIUM 8.6 11/23/2012 0655   ALKPHOS 114 04/01/2013 1039   AST 17 04/01/2013 1039   ALT 12 04/01/2013 1039   BILITOT 0.46 04/01/2013 1039       RADIOGRAPHIC STUDIES:  No results found.  ASSESSMENT: 80 year old female with  #1 2.7 cm invasive ductal carcinoma that is ER +70% PR negative HER-2/neu positive with a Ki-67 of 19% status post right lumpectomy with a positive lymph node for metastatic disease. Patient declined adjuvant HER-2/chemotherapy. She is receiving radiation therapy tolerating it well.   #2 Declined chemo/herceptin.   PLAN:   #1 begin letrozole 2.5 mg daily   #2 return in 3 months for follow up  #3 take calcium and vitamin D3   #4 we will order a bone density scan   All questions were answered. The patient knows to call the clinic with any problems, questions or concerns. We can certainly see the patient much sooner if necessary.  I spent 25 minutes counseling the patient face to face. The total time spent in the appointment was 30 minutes.    Drue Second, MD Medical/Oncology [facility] [phone] (beeper) [phone] (Office)  04/24/2013, 9:39 PM

## 2013-06-08 ENCOUNTER — Other Ambulatory Visit: Payer: Self-pay

## 2013-07-01 ENCOUNTER — Ambulatory Visit (HOSPITAL_BASED_OUTPATIENT_CLINIC_OR_DEPARTMENT_OTHER): Payer: Medicare PPO | Admitting: Oncology

## 2013-07-01 ENCOUNTER — Ambulatory Visit: Payer: Medicare PPO | Admitting: Oncology

## 2013-07-01 ENCOUNTER — Telehealth: Payer: Self-pay | Admitting: *Deleted

## 2013-07-01 ENCOUNTER — Encounter: Payer: Self-pay | Admitting: Oncology

## 2013-07-01 ENCOUNTER — Other Ambulatory Visit (HOSPITAL_BASED_OUTPATIENT_CLINIC_OR_DEPARTMENT_OTHER): Payer: Medicare PPO | Admitting: Lab

## 2013-07-01 ENCOUNTER — Other Ambulatory Visit: Payer: Medicare PPO | Admitting: Lab

## 2013-07-01 VITALS — BP 116/67 | HR 74 | Temp 98.7°F | Resp 20 | Ht 62.0 in | Wt 202.4 lb

## 2013-07-01 DIAGNOSIS — C773 Secondary and unspecified malignant neoplasm of axilla and upper limb lymph nodes: Secondary | ICD-10-CM

## 2013-07-01 DIAGNOSIS — Z923 Personal history of irradiation: Secondary | ICD-10-CM

## 2013-07-01 DIAGNOSIS — K5900 Constipation, unspecified: Secondary | ICD-10-CM

## 2013-07-01 DIAGNOSIS — C50411 Malignant neoplasm of upper-outer quadrant of right female breast: Secondary | ICD-10-CM

## 2013-07-01 DIAGNOSIS — C50419 Malignant neoplasm of upper-outer quadrant of unspecified female breast: Secondary | ICD-10-CM

## 2013-07-01 DIAGNOSIS — Z170 Estrogen receptor positive status [ER+]: Secondary | ICD-10-CM

## 2013-07-01 DIAGNOSIS — E559 Vitamin D deficiency, unspecified: Secondary | ICD-10-CM | POA: Insufficient documentation

## 2013-07-01 LAB — COMPREHENSIVE METABOLIC PANEL (CC13)
Alkaline Phosphatase: 88 U/L (ref 40–150)
Creatinine: 0.9 mg/dL (ref 0.6–1.1)
Glucose: 116 mg/dl (ref 70–140)
Sodium: 145 mEq/L (ref 136–145)
Total Bilirubin: 0.4 mg/dL (ref 0.20–1.20)
Total Protein: 6.7 g/dL (ref 6.4–8.3)

## 2013-07-01 LAB — CBC WITH DIFFERENTIAL/PLATELET
Basophils Absolute: 0 10*3/uL (ref 0.0–0.1)
EOS%: 1.5 % (ref 0.0–7.0)
HGB: 13.7 g/dL (ref 11.6–15.9)
MCH: 31.3 pg (ref 25.1–34.0)
MCV: 92.6 fL (ref 79.5–101.0)
MONO%: 7.6 % (ref 0.0–14.0)
RBC: 4.38 10*6/uL (ref 3.70–5.45)
RDW: 14.1 % (ref 11.2–14.5)

## 2013-07-01 NOTE — Progress Notes (Signed)
OFFICE PROGRESS NOTE  CC  [name], MD [address] [place] [address] Dr. Lodema Pilot  Dr. Antony Blackbird  DIAGNOSIS: 79 year old female with new diagnosis of invasive ductal carcinoma stage III of the right breast diagnosed December 2013.  STAGE:  Right breast screening 12/20013  S/p Right lumpectomy with SNL  2.7 cm IDC, ER7%/PR-/Her2Neu+ (2.83),  Ki-67 19%%  8/11 Lymph nodes positive for metastatic disease   PRIOR THERAPY: #1 Patient had a routine screening mammogram performed in December 2013. This showed an abnormality in the upper outer quadrant of the right breast. There was also noted to be suspicious lymph nodes in the right axilla. She underwent biopsies of the both areas revealing invasive ductal carcinoma the right breast with metastatic carcinoma in the right axillary lymph node. HER-2/neu was amplified at 2.83. Tumor was partially staining at 7% for estrogen receptor and negative for progesterone receptor. MRI of the breast area revealed 2.7 x 2.4 x 2.2 cm lesion deep within the upper outer quadrant of the right breast. There were also noted to be suspicious level I right axillary lymph nodes. PET scan performed and revealed malignant uptake in the right breast lesion as well as hypermetabolic activity in the right axillary and right retropectoral lymph node areas.   #2Patient subsequently went to partial mastectomy with limited axillary dissection with plans for postoperative radiation therapy. On her final pathology she was found to have a 2.7 cm invasive moderately differentiated mammary carcinoma with mixed ductal and lobular phenotype. Lymphovascular space invasion was noted. 8 of 11 lymph nodes were positive for metastatic disease  #3 was seen by me on 2/3 /2014 for discussion of treatment options. Patient was given option of receiving chemotherapy but she declined. Therefore she was referred to radiation oncology for adjuvant radiation.  She was begun on this and we'll completed in May 2014.  #4 began letrozole 2.5 mg daily starting 04/01/13. Total of 5 years of therapy is planned   CURRENT THERAPY: letrozole 2.5 mg daily  INTERVAL HISTORY: Amber [name] 79 y.o. female returns for followup visit. Tolerating letrozole very well. She is constipated I do think it is multifactorial. I have recommended to her increasing fluid intake as well as fiber and try Senokot on a regular basis. Otherwise she denies any fevers chills night sweats headaches, no myalgias and arthralgias. She has no peripheral paresthesias. Remainder of the 10 point review of systems is negative.  MEDICAL HISTORY: Past Medical History  Diagnosis Date  . Arthritis   . Asthma   . Diabetes mellitus without complication   . GERD (gastroesophageal reflux disease)   . Hyperlipidemia   . Hypertension   . Neuromuscular disorder   . Thyroid disease   . Hearing loss   . Visual disturbance   . Hypothyroidism   . Shortness of breath     occasionally with "excitement"  . Breast cancer 2013    right  . Hx of radiation therapy 01/27/13- 03/14/13    right breast axillary/supraclavicular region 45 Gy in 25 fx, upper outer breast cummulative dose of 61 Gy    ALLERGIES:  has No Known Allergies.  MEDICATIONS:  Current Outpatient Prescriptions  Medication Sig Dispense Refill  . ADVAIR DISKUS 250-50 MCG/DOSE AEPB Inhale 1 puff into the lungs 2 (two) times daily.       Marland Kitchen amLODipine (NORVASC) 5 MG tablet Take 5 mg by mouth Daily.       Marland Kitchen aspirin 81 MG tablet Take 81  mg by mouth daily.      . cholecalciferol (VITAMIN D) 1000 UNITS tablet Take 1 tablet (1,000 Units total) by mouth daily.  90 tablet  12  . diazepam (VALIUM) 5 MG tablet Three times daily as needed.      . furosemide (LASIX) 80 MG tablet Take 80 mg by mouth Daily.       Marland Kitchen HYDROcodone-acetaminophen (NORCO/VICODIN) 5-325 MG per tablet Take 1 tablet by mouth every 4 (four) hours as needed.  40 tablet  0   . letrozole (FEMARA) 2.5 MG tablet Take 1 tablet (2.5 mg total) by mouth daily.  90 tablet  12  . potassium chloride SA (K-DUR,KLOR-CON) 20 MEQ tablet Take 20 mEq by mouth daily.       . pravastatin (PRAVACHOL) 40 MG tablet Take 40 mg by mouth Daily.       Marland Kitchen PROAIR HFA 108 (90 BASE) MCG/ACT inhaler Inhale 1 puff into the lungs as needed.       . promethazine (PHENERGAN) 25 MG tablet Take 25 mg by mouth every 12 (twelve) hours as needed. Take 1/2 or 1 whole table per dosage as needed.      . propranolol (INDERAL) 40 MG tablet Take 40 mg by mouth Daily.       Marland Kitchen SYNTHROID 150 MCG tablet Take 150 mcg by mouth Daily.       . traMADol (ULTRAM) 50 MG tablet Every 6 hours as needed.       No current facility-administered medications for this visit.    SURGICAL HISTORY:  Past Surgical History  Procedure Laterality Date  . Back surgery  1980 - approximate  . Carpal tunnel release  unsure of date    both hands  . Rotator cuff repair  unsure of date    not sure which shoulder  . Elbow surgery  unsure of date  . Cervical spine surgery  unsure of date  . Abdominal hysterectomy    . Breast lumpectomy with needle localization and axillary lymph node dissection  11/22/2012    Procedure: BREAST LUMPECTOMY WITH NEDLE LOCALIZATION AND AXILLARY LYMPH NODE DISSECTION;  Surgeon: Lodema Pilot, DO;  Location: [facility] OR;  Service: General;  Laterality: Right;    REVIEW OF SYSTEMS:  Pertinent items are noted in HPI.   HEALTH MAINTENANCE:   PHYSICAL EXAMINATION: Blood pressure 116/67, pulse 74, temperature 98.7 F (37.1 C), temperature source Oral, resp. rate 20, height 5\' 2"  (1.575 m), weight 202 lb 6.4 oz (91.808 kg). Body mass index is 37.01 kg/(m^2). ECOG PERFORMANCE STATUS: 2 - Symptomatic, <50% confined to bed   General appearance: alert and cooperative Resp: clear to auscultation bilaterally Cardio: regular rate and rhythm GI: soft, non-tender; bowel sounds normal; no masses,  no  organomegaly Extremities: edema +1 Neurologic: Grossly normal   LABORATORY DATA: Lab Results  Component Value Date   WBC 3.6* 07/01/2013   HGB 13.7 07/01/2013   HCT 40.5 07/01/2013   MCV 92.6 07/01/2013   PLT 169 07/01/2013      Chemistry      Component Value Date/Time   NA 145 07/01/2013 0902   NA 139 11/23/2012 0655   K 4.0 07/01/2013 0902   K 3.5 11/23/2012 0655   CL 103 04/01/2013 1039   CL 102 11/23/2012 0655   CO2 28 07/01/2013 0902   CO2 30 11/23/2012 0655   BUN 14.4 07/01/2013 0902   BUN 15 11/23/2012 0655   CREATININE 0.9 07/01/2013 0902   CREATININE 1.03 11/23/2012 1610  Component Value Date/Time   CALCIUM 9.6 07/01/2013 0902   CALCIUM 8.6 11/23/2012 0655   ALKPHOS 88 07/01/2013 0902   AST 15 07/01/2013 0902   ALT 11 07/01/2013 0902   BILITOT 0.40 07/01/2013 0902       RADIOGRAPHIC STUDIES:  No results found.  ASSESSMENT: 79 year old female with  #1 2.7 cm invasive ductal carcinoma that is ER +70% PR negative HER-2/neu positive with a Ki-67 of 19% status post right lumpectomy with a positive lymph node for metastatic disease. Patient declined adjuvant HER-2/chemotherapy. She is receiving radiation therapy tolerating it well. No evidence of recurrent disease  #2 Declined chemo/herceptin.  #3 Constipation recommended using a stool softener twice a day such as Senokot.   PLAN:   #1 begin letrozole 2.5 mg daily   #2 return in 6 months for follow up   All questions were answered. The patient knows to call the clinic with any problems, questions or concerns. We can certainly see the patient much sooner if necessary.  I spent 25 minutes counseling the patient face to face. The total time spent in the appointment was 30 minutes.    Drue Second, MD Medical/Oncology [facility] [phone] (beeper) [phone] (Office)  07/01/2013, 10:20 AM

## 2013-07-01 NOTE — Telephone Encounter (Signed)
appts made and printed...td 

## 2013-07-01 NOTE — Patient Instructions (Addendum)
Continue letrozole daily  Take some stool softner twice a day (senna)  I will see you back in 6 months

## 2013-07-01 NOTE — Progress Notes (Signed)
OFFICE PROGRESS NOTE  CC  [name], MD [address] [place] [address] Dr. Lodema Pilot  Dr. Antony Blackbird  DIAGNOSIS: 79 year old female with new diagnosis of invasive ductal carcinoma stage III of the right breast diagnosed December 2013.  STAGE:  Right breast screening 12/20013  S/p Right lumpectomy with SNL  2.7 cm IDC, ER7%/PR-/Her2Neu+ (2.83),  Ki-67 19%%  8/11 Lymph nodes positive for metastatic disease   PRIOR THERAPY: #1 Patient had a routine screening mammogram performed in December 2013. This showed an abnormality in the upper outer quadrant of the right breast. There was also noted to be suspicious lymph nodes in the right axilla. She underwent biopsies of the both areas revealing invasive ductal carcinoma the right breast with metastatic carcinoma in the right axillary lymph node. HER-2/neu was amplified at 2.83. Tumor was partially staining at 7% for estrogen receptor and negative for progesterone receptor. MRI of the breast area revealed 2.7 x 2.4 x 2.2 cm lesion deep within the upper outer quadrant of the right breast. There were also noted to be suspicious level I right axillary lymph nodes. PET scan performed and revealed malignant uptake in the right breast lesion as well as hypermetabolic activity in the right axillary and right retropectoral lymph node areas.   #2Patient subsequently went to partial mastectomy with limited axillary dissection with plans for postoperative radiation therapy. On her final pathology she was found to have a 2.7 cm invasive moderately differentiated mammary carcinoma with mixed ductal and lobular phenotype. Lymphovascular space invasion was noted. 8 of 11 lymph nodes were positive for metastatic disease  #3 was seen by me on 2/3 /2014 for discussion of treatment options. Patient was given option of receiving chemotherapy but she declined. Therefore she was referred to radiation oncology for adjuvant radiation.  She was begun on this and we'll completed in May 2014.  #4 began letrozole 2.5 mg daily starting 04/01/13. Total of 5 years of therapy is planned   CURRENT THERAPY: letrozole 2.5 mg daily  INTERVAL HISTORY: Amber [name] 79 y.o. female returns for followup visit. She is tolerating the letrozole. But complains about constipation.. She does have some tenderness and redness of the skin. Otherwise she denies any fevers chills night sweats headaches, no myalgias and arthralgias. She has no peripheral paresthesias. Remainder of the 10 point review of systems is negative.  MEDICAL HISTORY: Past Medical History  Diagnosis Date  . Arthritis   . Asthma   . Diabetes mellitus without complication   . GERD (gastroesophageal reflux disease)   . Hyperlipidemia   . Hypertension   . Neuromuscular disorder   . Thyroid disease   . Hearing loss   . Visual disturbance   . Hypothyroidism   . Shortness of breath     occasionally with "excitement"  . Breast cancer 2013    right  . Hx of radiation therapy 01/27/13- 03/14/13    right breast axillary/supraclavicular region 45 Gy in 25 fx, upper outer breast cummulative dose of 61 Gy    ALLERGIES:  has No Known Allergies.  MEDICATIONS:  Current Outpatient Prescriptions  Medication Sig Dispense Refill  . ADVAIR DISKUS 250-50 MCG/DOSE AEPB Inhale 1 puff into the lungs 2 (two) times daily.       Marland Kitchen amLODipine (NORVASC) 5 MG tablet Take 5 mg by mouth Daily.       Marland Kitchen aspirin 81 MG tablet Take 81 mg by mouth daily.      . cholecalciferol (VITAMIN D)  1000 UNITS tablet Take 1 tablet (1,000 Units total) by mouth daily.  90 tablet  12  . diazepam (VALIUM) 5 MG tablet Three times daily as needed.      . furosemide (LASIX) 80 MG tablet Take 80 mg by mouth Daily.       Marland Kitchen HYDROcodone-acetaminophen (NORCO/VICODIN) 5-325 MG per tablet Take 1 tablet by mouth every 4 (four) hours as needed.  40 tablet  0  . letrozole (FEMARA) 2.5 MG tablet Take 1 tablet (2.5 mg total)  by mouth daily.  90 tablet  12  . potassium chloride SA (K-DUR,KLOR-CON) 20 MEQ tablet Take 20 mEq by mouth daily.       . pravastatin (PRAVACHOL) 40 MG tablet Take 40 mg by mouth Daily.       Marland Kitchen PROAIR HFA 108 (90 BASE) MCG/ACT inhaler Inhale 1 puff into the lungs as needed.       . promethazine (PHENERGAN) 25 MG tablet Take 25 mg by mouth every 12 (twelve) hours as needed. Take 1/2 or 1 whole table per dosage as needed.      . propranolol (INDERAL) 40 MG tablet Take 40 mg by mouth Daily.       Marland Kitchen SYNTHROID 150 MCG tablet Take 150 mcg by mouth Daily.       . traMADol (ULTRAM) 50 MG tablet Every 6 hours as needed.       No current facility-administered medications for this visit.    SURGICAL HISTORY:  Past Surgical History  Procedure Laterality Date  . Back surgery  1980 - approximate  . Carpal tunnel release  unsure of date    both hands  . Rotator cuff repair  unsure of date    not sure which shoulder  . Elbow surgery  unsure of date  . Cervical spine surgery  unsure of date  . Abdominal hysterectomy    . Breast lumpectomy with needle localization and axillary lymph node dissection  11/22/2012    Procedure: BREAST LUMPECTOMY WITH NEDLE LOCALIZATION AND AXILLARY LYMPH NODE DISSECTION;  Surgeon: Lodema Pilot, DO;  Location: [facility] OR;  Service: General;  Laterality: Right;    REVIEW OF SYSTEMS:  Pertinent items are noted in HPI.   HEALTH MAINTENANCE:   PHYSICAL EXAMINATION: Blood pressure 116/67, pulse 74, temperature 98.7 F (37.1 C), temperature source Oral, resp. rate 20, height 5\' 2"  (1.575 m), weight 202 lb 6.4 oz (91.808 kg). Body mass index is 37.01 kg/(m^2). ECOG PERFORMANCE STATUS: 2 - Symptomatic, <50% confined to bed   General appearance: alert and cooperative Resp: clear to auscultation bilaterally Cardio: regular rate and rhythm GI: soft, non-tender; bowel sounds normal; no masses,  no organomegaly Extremities: edema +1 Neurologic: Grossly normal   LABORATORY  DATA: Lab Results  Component Value Date   WBC 3.6* 07/01/2013   HGB 13.7 07/01/2013   HCT 40.5 07/01/2013   MCV 92.6 07/01/2013   PLT 169 07/01/2013      Chemistry      Component Value Date/Time   NA 145 07/01/2013 0902   NA 139 11/23/2012 0655   K 4.0 07/01/2013 0902   K 3.5 11/23/2012 0655   CL 103 04/01/2013 1039   CL 102 11/23/2012 0655   CO2 28 07/01/2013 0902   CO2 30 11/23/2012 0655   BUN 14.4 07/01/2013 0902   BUN 15 11/23/2012 0655   CREATININE 0.9 07/01/2013 0902   CREATININE 1.03 11/23/2012 0655      Component Value Date/Time   CALCIUM 9.6 07/01/2013  0902   CALCIUM 8.6 11/23/2012 0655   ALKPHOS 88 07/01/2013 0902   AST 15 07/01/2013 0902   ALT 11 07/01/2013 0902   BILITOT 0.40 07/01/2013 0902       RADIOGRAPHIC STUDIES:  No results found.  ASSESSMENT: 79 year old female with  #1 2.7 cm invasive ductal carcinoma that is ER +70% PR negative HER-2/neu positive with a Ki-67 of 19% status post right lumpectomy with a positive lymph node for metastatic disease. Patient declined adjuvant HER-2/chemotherapy. She is receiving radiation therapy tolerating it well. No evidence of recurrent cancer  #2 Declined chemo/herceptin.  #3 constipation recommended increasing fiber, fluids and take senna twice a day   PLAN:   #1  Continue letrozole 2.5 mg daily   #2 return in 6 months for follow up    All questions were answered. The patient knows to call the clinic with any problems, questions or concerns. We can certainly see the patient much sooner if necessary.  I spent 25 minutes counseling the patient face to face. The total time spent in the appointment was 30 minutes.    Drue Second, MD Medical/Oncology [facility] [phone] (beeper) [phone] (Office)  07/01/2013, 10:08 AM

## 2013-07-06 ENCOUNTER — Telehealth: Payer: Self-pay | Admitting: Medical Oncology

## 2013-07-06 NOTE — Telephone Encounter (Signed)
Please tell her to stop Vitamin D 1000 units daily.  Her vitamin d level is 21.  She needs Ergocalciferol 50,000 units PO on Sunday only x 8 weeks, disp 4 with one refill, then, follow with Vitamin D3 2000 units daily.    Thanks, L

## 2013-07-06 NOTE — Telephone Encounter (Signed)
Per NP, called pt to inquire dosage of vit D. Spoke with daughter, Steward Drone, she states pt was taking the vit D 1000 units daily but stopped for a while due to constipation and has been advised by MD to take a stool softener in am and pm. Daughter stated will tell her mother to start taking her vit D again daily. Denies further questions, knows to call office with any questions or concerns.

## 2013-07-06 NOTE — Telephone Encounter (Signed)
Message copied by Rexene Edison on Wed Jul 06, 2013  9:16 AM ------      Message from: Laural Golden      Created: Tue Jul 05, 2013  2:56 PM       Does this patient take any Vitamin D?  If so what?  Can you clarify that they are taking it?            Thanks,       L      ----- Message -----         From: [facility] Interface         Sent: 07/01/2013   9:19 AM           To: Victorino December, MD                   ------

## 2013-09-08 ENCOUNTER — Other Ambulatory Visit: Payer: Self-pay

## 2013-10-31 ENCOUNTER — Encounter (HOSPITAL_COMMUNITY): Payer: Self-pay | Admitting: Emergency Medicine

## 2013-10-31 ENCOUNTER — Emergency Department (INDEPENDENT_AMBULATORY_CARE_PROVIDER_SITE_OTHER): Payer: Medicare PPO

## 2013-10-31 ENCOUNTER — Emergency Department (HOSPITAL_COMMUNITY)
Admission: EM | Admit: 2013-10-31 | Discharge: 2013-10-31 | Disposition: A | Discharge status: Home / Self Care | Payer: Medicare PPO | Source: Home / Self Care | Attending: Emergency Medicine | Admitting: Emergency Medicine

## 2013-10-31 DIAGNOSIS — Z7982 Long term (current) use of aspirin: Secondary | ICD-10-CM

## 2013-10-31 DIAGNOSIS — Z923 Personal history of irradiation: Secondary | ICD-10-CM

## 2013-10-31 DIAGNOSIS — Z800 Family history of malignant neoplasm of digestive organs: Secondary | ICD-10-CM

## 2013-10-31 DIAGNOSIS — Z8049 Family history of malignant neoplasm of other genital organs: Secondary | ICD-10-CM

## 2013-10-31 DIAGNOSIS — E119 Type 2 diabetes mellitus without complications: Secondary | ICD-10-CM | POA: Diagnosis present

## 2013-10-31 DIAGNOSIS — Z79899 Other long term (current) drug therapy: Secondary | ICD-10-CM

## 2013-10-31 DIAGNOSIS — E039 Hypothyroidism, unspecified: Secondary | ICD-10-CM | POA: Diagnosis present

## 2013-10-31 DIAGNOSIS — G709 Myoneural disorder, unspecified: Secondary | ICD-10-CM | POA: Diagnosis present

## 2013-10-31 DIAGNOSIS — H9190 Unspecified hearing loss, unspecified ear: Secondary | ICD-10-CM | POA: Diagnosis present

## 2013-10-31 DIAGNOSIS — C50419 Malignant neoplasm of upper-outer quadrant of unspecified female breast: Secondary | ICD-10-CM | POA: Diagnosis present

## 2013-10-31 DIAGNOSIS — J209 Acute bronchitis, unspecified: Secondary | ICD-10-CM | POA: Diagnosis present

## 2013-10-31 DIAGNOSIS — J111 Influenza due to unidentified influenza virus with other respiratory manifestations: Secondary | ICD-10-CM

## 2013-10-31 DIAGNOSIS — K219 Gastro-esophageal reflux disease without esophagitis: Secondary | ICD-10-CM | POA: Diagnosis present

## 2013-10-31 DIAGNOSIS — Z602 Problems related to living alone: Secondary | ICD-10-CM

## 2013-10-31 DIAGNOSIS — E876 Hypokalemia: Secondary | ICD-10-CM | POA: Diagnosis present

## 2013-10-31 DIAGNOSIS — E869 Volume depletion, unspecified: Secondary | ICD-10-CM | POA: Diagnosis present

## 2013-10-31 DIAGNOSIS — J45901 Unspecified asthma with (acute) exacerbation: Principal | ICD-10-CM | POA: Diagnosis present

## 2013-10-31 DIAGNOSIS — M129 Arthropathy, unspecified: Secondary | ICD-10-CM | POA: Diagnosis present

## 2013-10-31 DIAGNOSIS — I10 Essential (primary) hypertension: Secondary | ICD-10-CM | POA: Diagnosis present

## 2013-10-31 DIAGNOSIS — J019 Acute sinusitis, unspecified: Secondary | ICD-10-CM | POA: Diagnosis present

## 2013-10-31 DIAGNOSIS — E785 Hyperlipidemia, unspecified: Secondary | ICD-10-CM | POA: Diagnosis present

## 2013-10-31 MED ORDER — ACETAMINOPHEN 325 MG PO TABS
ORAL_TABLET | ORAL | Status: AC
  Filled 2013-10-31: qty 2

## 2013-10-31 NOTE — ED Notes (Signed)
Pt given 650 mg of tylenol for fever at 1728.  Mw,cma

## 2013-10-31 NOTE — ED Provider Notes (Signed)
Chief Complaint   Chief Complaint  Patient presents with  . URI    History of Present Illness   Amber [name] is an 79 year old female with hypertension, diabetes, breast cancer, angina who has had a three-week history of upper respiratory symptoms with bilateral earache, cough, wheezing, chest tightness, chest pain, nasal congestion, rhinorrhea, sore throat, nausea, headache, aching in her eyes, and photophobia. She was not febrile up until the past 2-3 days but today she is running a temperature 101.3 degrees and she feels chilled. The patient states she has gotten the flu vaccine this year twice.  Review of Systems   Other than as noted above, the patient denies any of the following symptoms: Systemic:  No fevers, chills, sweats, or myalgias. Eye:  No redness or discharge. ENT:  No ear pain, headache, nasal congestion, drainage, sinus pressure, or sore throat. Neck:  No neck pain, stiffness, or swollen glands. Lungs:  No cough, sputum production, hemoptysis, wheezing, chest tightness, shortness of breath or chest pain. GI:  No abdominal pain, nausea, vomiting or diarrhea.  PMFSH   Past medical history, family history, social history, meds, and allergies were reviewed. She has no medication allergies. Current meds include amlodipine, aspirin, vitamin D, Valium, furosemide, Femara, potassium chloride, Pravachol, pro-air, Phenergan, propranolol, Synthroid, Advair, Norco, and tramadol. She has a history of hypertension, hypercholesterolemia, type 2 diabetes, breast cancer, and angina.  Physical exam   Vital signs:  BP 147/68  Pulse 90  Temp(Src) 101.3 F (38.5 C) (Oral)  Resp 22  SpO2 93% General:  Alert and oriented.  In no distress.  Skin warm and dry. Eye:  No conjunctival injection or drainage. Lids were normal. ENT:  TMs and canals were normal, without erythema or inflammation.  Nasal mucosa was clear and uncongested, without drainage.  Mucous membranes were moist.   Pharynx was clear with no exudate or drainage.  There were no oral ulcerations or lesions. Neck:  Supple, no adenopathy, tenderness or mass. Lungs:  No respiratory distress.  Lungs were clear to auscultation, without wheezes, rales or rhonchi.  Breath sounds were clear and equal bilaterally.  Heart:  Regular rhythm, without gallops, murmers or rubs. Skin:  Clear, warm, and dry, without rash or lesions.   Radiology   Dg Chest 2 View  10/31/2013   CLINICAL DATA:  Cough and fever  EXAM: CHEST  2 VIEW  COMPARISON:  November 18, 2012  FINDINGS: The heart size and mediastinal contours are stable. There is chronic prominence of opacity of the right hilum and infrahilar region unchanged compared to prior exam. There is no pulmonary edema or pleural effusion. The visualized skeletal structures are stable.  IMPRESSION: No active cardiopulmonary disease. Stable chronic changes of the right hilar and infrahilar region.   Electronically Signed   By: Sherian Rein M.D.   On: 10/31/2013 18:04   Assessment     The encounter diagnosis was Influenza.  She has a fever and a cough. Her chest x-ray was normal, making influenza the most likely diagnosis. She is at high risk being at an advanced age, having a low oxygen saturation, and multiple medical comorbidities as well as difficulty breathing and chest tightness.  Plan    The patient was transferred to the ED via shuttle in stable condition.  Medical Decision Making     79 year old female has a 2 to 3 week history of cough, chest tightness, wheezing, nasal congestion, rhinorrhea, and sore throat.  Over the past 2 to  3 days she has run a fever, today as high as 101.3.  Her O2 sat was 93% on room air.  Her CXR showed no pneumonia, so it looks like she has influenza.  She is at high risk due to her age and decreased O2 sat.        Reuben Likes, MD 10/31/13 [phone]

## 2013-10-31 NOTE — ED Notes (Signed)
C/o  Productive cough with yellow sputum.  Chest congestion with sob.  Nausea.  And fever.   Denies vomiting and diarrhea.  On set  2 days ago.  No otc meds taken for symptoms.

## 2013-10-31 NOTE — ED Notes (Signed)
Pt is an [facility] transfer.  Pt sent her to r/o flu.  Pt states productive cough with yellow sputum x 2-3 weeks.  [facility] completed CXR negative for pneumonia.  Pt has had a fever for 2 - 3 days.

## 2013-11-01 ENCOUNTER — Encounter (HOSPITAL_COMMUNITY): Payer: Self-pay | Admitting: General Practice

## 2013-11-01 ENCOUNTER — Inpatient Hospital Stay (HOSPITAL_COMMUNITY)
Admission: EM | Admit: 2013-11-01 | Discharge: 2013-11-05 | DRG: 203 | Disposition: A | Discharge status: Home Health | Payer: Medicare PPO | Attending: Internal Medicine | Admitting: Internal Medicine

## 2013-11-01 DIAGNOSIS — J45901 Unspecified asthma with (acute) exacerbation: Principal | ICD-10-CM | POA: Diagnosis present

## 2013-11-01 DIAGNOSIS — I10 Essential (primary) hypertension: Secondary | ICD-10-CM | POA: Diagnosis present

## 2013-11-01 DIAGNOSIS — C50411 Malignant neoplasm of upper-outer quadrant of right female breast: Secondary | ICD-10-CM | POA: Diagnosis present

## 2013-11-01 DIAGNOSIS — E119 Type 2 diabetes mellitus without complications: Secondary | ICD-10-CM | POA: Diagnosis present

## 2013-11-01 DIAGNOSIS — R0902 Hypoxemia: Secondary | ICD-10-CM

## 2013-11-01 DIAGNOSIS — E876 Hypokalemia: Secondary | ICD-10-CM | POA: Diagnosis present

## 2013-11-01 DIAGNOSIS — E039 Hypothyroidism, unspecified: Secondary | ICD-10-CM | POA: Diagnosis present

## 2013-11-01 DIAGNOSIS — J209 Acute bronchitis, unspecified: Secondary | ICD-10-CM | POA: Diagnosis present

## 2013-11-01 DIAGNOSIS — J019 Acute sinusitis, unspecified: Secondary | ICD-10-CM | POA: Diagnosis present

## 2013-11-01 HISTORY — DX: Pneumonia, unspecified organism: J18.9

## 2013-11-01 LAB — CBC WITH DIFFERENTIAL/PLATELET
Basophils Absolute: 0 10*3/uL (ref 0.0–0.1)
Basophils Relative: 0 % (ref 0–1)
Eosinophils Absolute: 0.2 10*3/uL (ref 0.0–0.7)
Eosinophils Relative: 3 % (ref 0–5)
HCT: 39.5 % (ref 36.0–46.0)
Lymphocytes Relative: 17 % (ref 12–46)
MCH: 31.7 pg (ref 26.0–34.0)
MCHC: 34.2 g/dL (ref 30.0–36.0)
Monocytes Absolute: 0.5 10*3/uL (ref 0.1–1.0)
Neutro Abs: 4.3 10*3/uL (ref 1.7–7.7)
Platelets: 149 10*3/uL — ABNORMAL LOW (ref 150–400)
RDW: 13.8 % (ref 11.5–15.5)
WBC: 5.9 10*3/uL (ref 4.0–10.5)

## 2013-11-01 LAB — INFLUENZA PANEL BY PCR (TYPE A & B)
Influenza A By PCR: NEGATIVE
Influenza B By PCR: NEGATIVE

## 2013-11-01 LAB — GLUCOSE, CAPILLARY: Glucose-Capillary: 129 mg/dL — ABNORMAL HIGH (ref 70–99)

## 2013-11-01 LAB — BASIC METABOLIC PANEL
BUN: 14 mg/dL (ref 6–23)
Calcium: 9.2 mg/dL (ref 8.4–10.5)
Chloride: 100 mEq/L (ref 96–112)
Creatinine, Ser: 1 mg/dL (ref 0.50–1.10)
GFR calc Af Amer: 58 mL/min — ABNORMAL LOW (ref >90)
GFR calc non Af Amer: 50 mL/min — ABNORMAL LOW (ref >90)
Sodium: 144 mEq/L (ref 137–147)

## 2013-11-01 MED ORDER — POTASSIUM CHLORIDE 10 MEQ/100ML IV SOLN
10.0000 meq | Freq: Once | INTRAVENOUS | Status: AC
  Administered 2013-11-01: 10 meq via INTRAVENOUS
  Filled 2013-11-01: qty 100

## 2013-11-01 MED ORDER — LEVOFLOXACIN IN D5W 500 MG/100ML IV SOLN
500.0000 mg | INTRAVENOUS | Status: DC
  Administered 2013-11-01 – 2013-11-02 (×2): 500 mg via INTRAVENOUS
  Filled 2013-11-01 (×3): qty 100

## 2013-11-01 MED ORDER — ASPIRIN 81 MG PO TABS
81.0000 mg | ORAL_TABLET | Freq: Every day | ORAL | Status: DC
  Administered 2013-11-01 – 2013-11-05 (×5): 81 mg via ORAL
  Filled 2013-11-01 (×5): qty 1

## 2013-11-01 MED ORDER — HYDROCODONE-ACETAMINOPHEN 5-325 MG PO TABS
1.0000 | ORAL_TABLET | ORAL | Status: DC | PRN
  Administered 2013-11-01 – 2013-11-05 (×9): 1 via ORAL
  Filled 2013-11-01 (×9): qty 1

## 2013-11-01 MED ORDER — FLUTICASONE PROPIONATE 50 MCG/ACT NA SUSP
2.0000 | Freq: Every day | NASAL | Status: DC
Start: 2013-11-01 — End: 2013-11-05
  Administered 2013-11-01 – 2013-11-05 (×5): 2 via NASAL
  Filled 2013-11-01: qty 16

## 2013-11-01 MED ORDER — ACETAMINOPHEN 325 MG PO TABS: 650.0000 mg | ORAL_TABLET | Freq: Four times a day (QID) | ORAL | Status: DC | PRN

## 2013-11-01 MED ORDER — ALBUTEROL SULFATE (2.5 MG/3ML) 0.083% IN NEBU
5.0000 mg | INHALATION_SOLUTION | Freq: Once | RESPIRATORY_TRACT | Status: AC
  Administered 2013-11-01: 5 mg via RESPIRATORY_TRACT

## 2013-11-01 MED ORDER — LEVOTHYROXINE SODIUM 150 MCG PO TABS
150.0000 ug | ORAL_TABLET | Freq: Every day | ORAL | Status: DC
  Administered 2013-11-02 – 2013-11-05 (×4): 150 ug via ORAL
  Filled 2013-11-01 (×6): qty 1

## 2013-11-01 MED ORDER — ALBUTEROL SULFATE (2.5 MG/3ML) 0.083% IN NEBU
2.5000 mg | INHALATION_SOLUTION | Freq: Four times a day (QID) | RESPIRATORY_TRACT | Status: AC
  Administered 2013-11-01 – 2013-11-02 (×3): 2.5 mg via RESPIRATORY_TRACT
  Filled 2013-11-01 (×4): qty 3

## 2013-11-01 MED ORDER — ALBUTEROL (5 MG/ML) CONTINUOUS INHALATION SOLN: 5.0000 mg/h | INHALATION_SOLUTION | RESPIRATORY_TRACT | Status: DC

## 2013-11-01 MED ORDER — ONDANSETRON HCL 4 MG/2ML IJ SOLN: 4.0000 mg | Freq: Four times a day (QID) | INTRAMUSCULAR | Status: DC | PRN

## 2013-11-01 MED ORDER — POTASSIUM CHLORIDE IN NACL 40-0.9 MEQ/L-% IV SOLN
INTRAVENOUS | Status: DC
  Administered 2013-11-01 – 2013-11-03 (×5): via INTRAVENOUS
  Filled 2013-11-01 (×10): qty 1000

## 2013-11-01 MED ORDER — DIAZEPAM 2 MG PO TABS: 2.0000 mg | ORAL_TABLET | Freq: Four times a day (QID) | ORAL | Status: DC | PRN

## 2013-11-01 MED ORDER — PROMETHAZINE HCL 25 MG PO TABS
12.5000 mg | ORAL_TABLET | Freq: Four times a day (QID) | ORAL | Status: DC | PRN
  Filled 2013-11-01: qty 1

## 2013-11-01 MED ORDER — ALBUTEROL SULFATE (2.5 MG/3ML) 0.083% IN NEBU
2.5000 mg | INHALATION_SOLUTION | RESPIRATORY_TRACT | Status: DC
  Administered 2013-11-01: 2.5 mg via RESPIRATORY_TRACT
  Filled 2013-11-01: qty 3

## 2013-11-01 MED ORDER — ACETAMINOPHEN 650 MG RE SUPP: 650.0000 mg | Freq: Four times a day (QID) | RECTAL | Status: DC | PRN

## 2013-11-01 MED ORDER — OSELTAMIVIR PHOSPHATE 75 MG PO CAPS
75.0000 mg | ORAL_CAPSULE | Freq: Once | ORAL | Status: AC
  Administered 2013-11-01: 75 mg via ORAL
  Filled 2013-11-01: qty 1

## 2013-11-01 MED ORDER — ALBUTEROL (5 MG/ML) CONTINUOUS INHALATION SOLN
10.0000 mg/h | INHALATION_SOLUTION | RESPIRATORY_TRACT | Status: AC
  Administered 2013-11-01: 10 mg/h via RESPIRATORY_TRACT
  Filled 2013-11-01: qty 20

## 2013-11-01 MED ORDER — POTASSIUM CHLORIDE CRYS ER 20 MEQ PO TBCR
20.0000 meq | EXTENDED_RELEASE_TABLET | Freq: Two times a day (BID) | ORAL | Status: DC
  Administered 2013-11-01 – 2013-11-03 (×5): 20 meq via ORAL
  Filled 2013-11-01 (×9): qty 1

## 2013-11-01 MED ORDER — ONDANSETRON HCL 4 MG PO TABS: 4.0000 mg | ORAL_TABLET | Freq: Four times a day (QID) | ORAL | Status: DC | PRN

## 2013-11-01 MED ORDER — SIMVASTATIN 20 MG PO TABS
20.0000 mg | ORAL_TABLET | Freq: Every day | ORAL | Status: DC
  Administered 2013-11-01 – 2013-11-04 (×4): 20 mg via ORAL
  Filled 2013-11-01 (×5): qty 1

## 2013-11-01 MED ORDER — MOMETASONE FURO-FORMOTEROL FUM 100-5 MCG/ACT IN AERO
2.0000 | INHALATION_SPRAY | Freq: Two times a day (BID) | RESPIRATORY_TRACT | Status: DC
  Administered 2013-11-01 – 2013-11-05 (×8): 2 via RESPIRATORY_TRACT
  Filled 2013-11-01 (×2): qty 8.8

## 2013-11-01 MED ORDER — ALBUTEROL SULFATE HFA 108 (90 BASE) MCG/ACT IN AERS
2.0000 | INHALATION_SPRAY | RESPIRATORY_TRACT | Status: DC | PRN
  Filled 2013-11-01: qty 6.7

## 2013-11-01 MED ORDER — ALUM & MAG HYDROXIDE-SIMETH 200-200-20 MG/5ML PO SUSP: 30.0000 mL | Freq: Four times a day (QID) | ORAL | Status: DC | PRN

## 2013-11-01 MED ORDER — LETROZOLE 2.5 MG PO TABS
2.5000 mg | ORAL_TABLET | Freq: Every day | ORAL | Status: DC
  Administered 2013-11-01 – 2013-11-05 (×5): 2.5 mg via ORAL
  Filled 2013-11-01 (×5): qty 1

## 2013-11-01 MED ORDER — ENOXAPARIN SODIUM 40 MG/0.4ML ~~LOC~~ SOLN
40.0000 mg | SUBCUTANEOUS | Status: DC
  Administered 2013-11-01 – 2013-11-04 (×4): 40 mg via SUBCUTANEOUS
  Filled 2013-11-01 (×6): qty 0.4

## 2013-11-01 MED ORDER — POTASSIUM CHLORIDE CRYS ER 20 MEQ PO TBCR
20.0000 meq | EXTENDED_RELEASE_TABLET | Freq: Every day | ORAL | Status: DC
  Administered 2013-11-01: 20 meq via ORAL
  Filled 2013-11-01: qty 1

## 2013-11-01 MED ORDER — ALBUTEROL SULFATE (2.5 MG/3ML) 0.083% IN NEBU
2.5000 mg | INHALATION_SOLUTION | RESPIRATORY_TRACT | Status: DC
  Administered 2013-11-01 (×2): 2.5 mg via RESPIRATORY_TRACT
  Filled 2013-11-01 (×2): qty 3

## 2013-11-01 NOTE — Progress Notes (Signed)
[facility] History and Physical  Amber [name] EPP:[id number] DOB: 1926-10-25 DOA: 11/01/2013  Referring physician: edp PCP: [name], MD   Chief Complaint: Cough  HPI: Amber [name] is a 79 y.o. female , lives alone, initially presented to urgent care with a cough. She was found to have a fever of 101.3. She was subsequently referred to the emergency room and reportedly had wheezing so was referred for observation for possible influenza and asthma exacerbation. Since then her flu swab has come back negative. She reports that the cough has been worsening over the past 3 weeks. She has a history of asthma and has been taking inhalers at home. She also reports rhinorrhea, mild weakness, some sinus pressure, postnasal drip. No vomiting or diarrhea. No myalgias. She usually gets around with a cane. Her daughter lives next door. Chest x-ray shows nothing acute. Labs significant only for hypokalemia. Oxygen saturations normal. Patient feels that if she could get some rest she might feel better. Spent the night in the emergency room. No sick contacts.   Review of Systems:  Systems reviewed. As above otherwise negative.  Past Medical History  Diagnosis Date  . Arthritis   . Asthma   . Diabetes mellitus without complication   . GERD (gastroesophageal reflux disease)   . Hyperlipidemia   . Hypertension   . Neuromuscular disorder   . Thyroid disease   . Hearing loss   . Visual disturbance   . Hypothyroidism   . Shortness of breath     occasionally with "excitement"  . Breast cancer 2013    right  . Hx of radiation therapy 01/27/13- 03/14/13    right breast axillary/supraclavicular region 45 Gy in 25 fx, upper outer breast cummulative dose of 61 Gy  . Pneumonia     HX OF PNA   Past Surgical History  Procedure Laterality Date  . Back surgery  1980 - approximate  . Carpal tunnel release  unsure of date    both hands  . Rotator cuff repair  unsure of date    not  sure which shoulder  . Elbow surgery  unsure of date  . Cervical spine surgery  unsure of date  . Abdominal hysterectomy    . Breast lumpectomy with needle localization and axillary lymph node dissection  11/22/2012    Procedure: BREAST LUMPECTOMY WITH NEDLE LOCALIZATION AND AXILLARY LYMPH NODE DISSECTION;  Surgeon: Lodema Pilot, DO;  Location: [facility] OR;  Service: General;  Laterality: Right;   Social History:  reports that she has never smoked. She has never used smokeless tobacco. She reports that she does not drink alcohol or use illicit drugs.  No Known Allergies  Family History  Problem Relation Age of Onset  . Cancer Mother     uterine  . Cancer Brother     oral, throat     Prior to Admission medications   Medication Sig Start Date End Date Taking? Authorizing Provider  ADVAIR DISKUS 250-50 MCG/DOSE AEPB Inhale 1 puff into the lungs 2 (two) times daily.  10/11/12  Yes Historical Provider, MD  amLODipine (NORVASC) 5 MG tablet Take 5 mg by mouth Daily.  10/11/12  Yes Historical Provider, MD  aspirin 81 MG tablet Take 81 mg by mouth daily.   Yes Historical Provider, MD  cholecalciferol (VITAMIN D) 1000 UNITS tablet Take 1 tablet (1,000 Units total) by mouth daily. 04/01/13  Yes Victorino December, MD  diazepam (VALIUM) 5 MG tablet Three times daily as needed. 09/14/12  Yes Historical Provider, MD  furosemide (LASIX) 80 MG tablet Take 80 mg by mouth Daily.  10/11/12  Yes Historical Provider, MD  HYDROcodone-acetaminophen (NORCO/VICODIN) 5-325 MG per tablet Take 1 tablet by mouth every 4 (four) hours as needed. 11/23/12  Yes Lodema Pilot, DO  letrozole [facility]) 2.5 MG tablet Take 1 tablet (2.5 mg total) by mouth daily. 04/01/13  Yes Victorino December, MD  potassium chloride SA (K-DUR,KLOR-CON) 20 MEQ tablet Take 20 mEq by mouth daily.  10/12/12  Yes Historical Provider, MD  pravastatin (PRAVACHOL) 40 MG tablet Take 40 mg by mouth Daily.  10/12/12  Yes Historical Provider, MD  PROAIR HFA 108 (90 BASE)  MCG/ACT inhaler Inhale 1 puff into the lungs as needed.  10/11/12  Yes Historical Provider, MD  promethazine (PHENERGAN) 25 MG tablet Take 25 mg by mouth every 12 (twelve) hours as needed. Take 1/2 or 1 whole table per dosage as needed.   Yes Historical Provider, MD  propranolol (INDERAL) 40 MG tablet Take 40 mg by mouth Daily.  10/12/12  Yes Historical Provider, MD  SYNTHROID 150 MCG tablet Take 150 mcg by mouth Daily.  10/11/12  Yes Historical Provider, MD  traMADol (ULTRAM) 50 MG tablet Every 6 hours as needed. 08/30/12  Yes Historical Provider, MD   Physical Exam: Filed Vitals:   11/01/13 1304  BP: 122/67  Pulse: 85  Temp: 98.5 F (36.9 C)  Resp:     BP 122/67  Pulse 85  Temp(Src) 98.5 F (36.9 C) (Oral)  Resp 18  SpO2 99%  BP 122/67  Pulse 85  Temp(Src) 98.5 F (36.9 C) (Oral)  Resp 18  SpO2 99%  General Appearance:    asleep. Arousable. Oriented and appropriate. Breathing nonlabored. Does appear slightly weak. Frequent coughing.   Head:    Normocephalic, without obvious abnormality, atraumatic  Eyes:    PERRL, squinting, left eye. No drainage or conjunctivitis     Nose:   parous without drainage. Mild maxillary tenderness.   Throat:  slightly dry mucous membranes. Oropharynx without erythema or exudate   Neck:   Supple, no JVD. Shotty submandibular lymphadenopathy   Back:     Symmetric, no curvature, ROM normal, no CVA tenderness  Lungs:     diminished throughout without wheezes rhonchi or rales   Chest Wall:    No tenderness or deformity   Heart:    Regular rate and rhythm, S1 and S2 normal, no murmur, rub   or gallop     Abdomen:     Soft, non-tender, bowel sounds active all four quadrants,    no masses, no organomegaly  Genitalia:    deferred  Rectal:    deferred  Extremities:   Extremities normal, atraumatic, no cyanosis or edema  Pulses:   2+ and symmetric all extremities  Skin:   Skin color, texture, turgor normal, no rashes or lesions  Lymph nodes:    Cervical, supraclavicular, and axillary nodes normal  Neurologic:   CNII-XII intact, normal strength, sensation and reflexes    throughout             Psych: normal affect  Labs on Admission:  Basic Metabolic Panel:  Recent Labs Lab 11/01/13 0056  NA 144  K 2.9*  CL 100  CO2 31  GLUCOSE 170*  BUN 14  CREATININE 1.00  CALCIUM 9.2   Liver Function Tests: No results found for this basename: AST, ALT, ALKPHOS, BILITOT, PROT, ALBUMIN,  in the last 168 hours No results found  for this basename: LIPASE, AMYLASE,  in the last 168 hours No results found for this basename: AMMONIA,  in the last 168 hours CBC:  Recent Labs Lab 11/01/13 0056  WBC 5.9  NEUTROABS 4.3  HGB 13.5  HCT 39.5  MCV 92.7  PLT 149*   Cardiac Enzymes: No results found for this basename: CKTOTAL, CKMB, CKMBINDEX, TROPONINI,  in the last 168 hours  BNP (last 3 results) No results found for this basename: PROBNP,  in the last 8760 hours CBG: No results found for this basename: GLUCAP,  in the last 168 hours  Radiological Exams on Admission: Dg Chest 2 View  10/31/2013   CLINICAL DATA:  Cough and fever  EXAM: CHEST  2 VIEW  COMPARISON:  November 18, 2012  FINDINGS: The heart size and mediastinal contours are stable. There is chronic prominence of opacity of the right hilum and infrahilar region unchanged compared to prior exam. There is no pulmonary edema or pleural effusion. The visualized skeletal structures are stable.  IMPRESSION: No active cardiopulmonary disease. Stable chronic changes of the right hilar and infrahilar region.   Electronically Signed   By: Sherian Rein M.D.   On: 10/31/2013 18:04    Assessment/Plan Principal Problem:   Asthma exacerbation: Currently without wheeze. Will schedule bronchodilators and hold off on steroids for now. Active Problems:   Acute bronchitis: Flu swab negative. Will give Levaquin which will cover bronchitis and sinusitis. Mucinex DM. Appears slightly volume  depleted. Hold Lasix and give IV fluids overnight.    Acute sinusitis: Flonase.    Cancer of upper-outer quadrant of female breast    Unspecified hypothyroidism    Essential hypertension, benig: Hold medications for now.    Hypokalemia: Replete. Check magnesium.    DM (diabetes mellitus), type 2:  Not on medications. Will monitor blood glucose.  The patient is elderly and lives alone. Will get a physical therapy evaluation for disposition recommendations.  Code Status: full Family Communication: none available. Pt lucid Disposition Plan: home  Time spent: 60 min  Blakelyn Dinges L [facility] Pager [phone]

## 2013-11-01 NOTE — ED Provider Notes (Signed)
CSN: [id number]     Arrival date & time 10/31/13  1841 History   First MD Initiated Contact with Patient 11/01/13 0030     Chief Complaint  Patient presents with  . Cough  . Fever   (Consider location/radiation/quality/duration/timing/severity/associated sxs/prior Treatment) HPI  79 year old female with a history of breast cancer, currently taking oral chemotherapy who presents with a complaint of shortness of breath. She states that approximately 3 days ago she developed nasal congestion followed by pain in her ears and has subsequently developed a fever and coughing with shortness of breath. Causing his persistent, gradually worsening over the course of the day and is associated with productive yellow phlegm. She has diffuse body aches but is unsure how much of this is her chronic arthritis. She has no known sick contacts, she did receive her flu shot, she did go to the urgent care first where she was found to have a clear chest x-ray and sent here for flu testing and evaluation.  Past Medical History  Diagnosis Date  . Arthritis   . Asthma   . Diabetes mellitus without complication   . GERD (gastroesophageal reflux disease)   . Hyperlipidemia   . Hypertension   . Neuromuscular disorder   . Thyroid disease   . Hearing loss   . Visual disturbance   . Hypothyroidism   . Shortness of breath     occasionally with "excitement"  . Breast cancer 2013    right  . Hx of radiation therapy 01/27/13- 03/14/13    right breast axillary/supraclavicular region 45 Gy in 25 fx, upper outer breast cummulative dose of 61 Gy   Past Surgical History  Procedure Laterality Date  . Back surgery  1980 - approximate  . Carpal tunnel release  unsure of date    both hands  . Rotator cuff repair  unsure of date    not sure which shoulder  . Elbow surgery  unsure of date  . Cervical spine surgery  unsure of date  . Abdominal hysterectomy    . Breast lumpectomy with needle localization and axillary lymph  node dissection  11/22/2012    Procedure: BREAST LUMPECTOMY WITH NEDLE LOCALIZATION AND AXILLARY LYMPH NODE DISSECTION;  Surgeon: Lodema Pilot, DO;  Location: [facility] OR;  Service: General;  Laterality: Right;   Family History  Problem Relation Age of Onset  . Cancer Mother     uterine  . Cancer Brother     oral, throat   History  Substance Use Topics  . Smoking status: Never Smoker   . Smokeless tobacco: Never Used  . Alcohol Use: No   OB History   Grav Para Term Preterm Abortions TAB SAB Ect Mult Living                 Review of Systems  All other systems reviewed and are negative.    Allergies  Review of patient's allergies indicates no known allergies.  Home Medications   Current Outpatient Rx  Name  Route  Sig  Dispense  Refill  . ADVAIR DISKUS 250-50 MCG/DOSE AEPB   Inhalation   Inhale 1 puff into the lungs 2 (two) times daily.          Marland Kitchen amLODipine (NORVASC) 5 MG tablet   Oral   Take 5 mg by mouth Daily.          Marland Kitchen aspirin 81 MG tablet   Oral   Take 81 mg by mouth daily.         Marland Kitchen  cholecalciferol (VITAMIN D) 1000 UNITS tablet   Oral   Take 1 tablet (1,000 Units total) by mouth daily.   90 tablet   12   . diazepam (VALIUM) 5 MG tablet      Three times daily as needed.         . furosemide (LASIX) 80 MG tablet   Oral   Take 80 mg by mouth Daily.          Marland Kitchen HYDROcodone-acetaminophen (NORCO/VICODIN) 5-325 MG per tablet   Oral   Take 1 tablet by mouth every 4 (four) hours as needed.   40 tablet   0   . letrozole (FEMARA) 2.5 MG tablet   Oral   Take 1 tablet (2.5 mg total) by mouth daily.   90 tablet   12   . potassium chloride SA (K-DUR,KLOR-CON) 20 MEQ tablet   Oral   Take 20 mEq by mouth daily.          . pravastatin (PRAVACHOL) 40 MG tablet   Oral   Take 40 mg by mouth Daily.          Marland Kitchen PROAIR HFA 108 (90 BASE) MCG/ACT inhaler   Inhalation   Inhale 1 puff into the lungs as needed.          . promethazine (PHENERGAN) 25  MG tablet   Oral   Take 25 mg by mouth every 12 (twelve) hours as needed. Take 1/2 or 1 whole table per dosage as needed.         . propranolol (INDERAL) 40 MG tablet   Oral   Take 40 mg by mouth Daily.          Marland Kitchen SYNTHROID 150 MCG tablet   Oral   Take 150 mcg by mouth Daily.          . traMADol (ULTRAM) 50 MG tablet      Every 6 hours as needed.          BP 127/54  Pulse 90  Temp(Src) 100.4 F (38 C) (Oral)  Resp 18  SpO2 99% Physical Exam  Nursing note and vitals reviewed. Constitutional: She appears well-developed and well-nourished. No distress.  HENT:  Head: Normocephalic and atraumatic.  Mouth/Throat: Oropharynx is clear and moist. No oropharyngeal exudate.  Tympanic membranes clear bilaterally  Eyes: Conjunctivae and EOM are normal. Pupils are equal, round, and reactive to light. Right eye exhibits no discharge. Left eye exhibits no discharge. No scleral icterus.  Neck: Normal range of motion. Neck supple. No JVD present. No thyromegaly present.  Cardiovascular: Regular rhythm, normal heart sounds and intact distal pulses.  Exam reveals no gallop and no friction rub.   No murmur heard. Tachycardic to 105  Pulmonary/Chest: Effort normal. No respiratory distress. She has wheezes ( Mild expiratory wheezing, speaking in full sentences). She has no rales.  Abdominal: Soft. Bowel sounds are normal. She exhibits no distension and no mass. There is no tenderness.  Musculoskeletal: Normal range of motion. She exhibits no edema and no tenderness.  Lymphadenopathy:    She has no cervical adenopathy.  Neurological: She is alert. Coordination normal.  Skin: Skin is warm and dry. No rash noted. No erythema.  Psychiatric: She has a normal mood and affect. Her behavior is normal.    ED Course  Procedures (including critical care time) Labs Review Labs Reviewed  CBC WITH DIFFERENTIAL - Abnormal; Notable for the following:    Platelets 149 (*)    All other components  within normal limits  BASIC METABOLIC PANEL - Abnormal; Notable for the following:    Potassium 2.9 (*)    Glucose, Bld 170 (*)    GFR calc non Af Amer 50 (*)    GFR calc Af Amer 58 (*)    All other components within normal limits  INFLUENZA PANEL BY PCR   Imaging Review Dg Chest 2 View  10/31/2013   CLINICAL DATA:  Cough and fever  EXAM: CHEST  2 VIEW  COMPARISON:  November 18, 2012  FINDINGS: The heart size and mediastinal contours are stable. There is chronic prominence of opacity of the right hilum and infrahilar region unchanged compared to prior exam. There is no pulmonary edema or pleural effusion. The visualized skeletal structures are stable.  IMPRESSION: No active cardiopulmonary disease. Stable chronic changes of the right hilar and infrahilar region.   Electronically Signed   By: Sherian Rein M.D.   On: 10/31/2013 18:04    EKG Interpretation   None       MDM   1. Influenza-like illness   2. Hypoxia   3. Hypokalemia    The patient has an exam that is consistent with respiratory tract infection, a flulike illness would be considered, bronchitis, reactive airway disease as well. With her wheezing and cough she will need albuterol treatment, I am concerned with her chemotherapy and fever, labs pending. I have reviewed the chest x-ray and agreed there is no signs of focal infiltrates.  Labs are overall unremarkable, the fever has defervesced but her hypoxia is still intermittent and concerning given that she is still wheezing after albuterol. Because of her advanced age I will have her admitted to the hospitalist service, telemetry unit, observation. Discussed with Dr. Allena Katz who agrees.  Vida Roller, MD 11/01/13 [phone]

## 2013-11-02 DIAGNOSIS — J111 Influenza due to unidentified influenza virus with other respiratory manifestations: Secondary | ICD-10-CM

## 2013-11-02 LAB — BASIC METABOLIC PANEL
Calcium: 8.5 mg/dL (ref 8.4–10.5)
GFR calc Af Amer: 68 mL/min — ABNORMAL LOW (ref >90)
GFR calc non Af Amer: 59 mL/min — ABNORMAL LOW (ref >90)
Sodium: 144 mEq/L (ref 137–147)

## 2013-11-02 LAB — MAGNESIUM: Magnesium: 1.9 mg/dL (ref 1.5–2.5)

## 2013-11-02 MED ORDER — IPRATROPIUM-ALBUTEROL 0.5-2.5 (3) MG/3ML IN SOLN
3.0000 mL | Freq: Four times a day (QID) | RESPIRATORY_TRACT | Status: DC
  Administered 2013-11-03 – 2013-11-05 (×8): 3 mL via RESPIRATORY_TRACT
  Filled 2013-11-02 (×18): qty 3

## 2013-11-02 MED ORDER — PREDNISONE 50 MG PO TABS
60.0000 mg | ORAL_TABLET | Freq: Every day | ORAL | Status: DC
  Administered 2013-11-02 – 2013-11-04 (×3): 60 mg via ORAL
  Filled 2013-11-02 (×4): qty 1

## 2013-11-02 MED ORDER — IPRATROPIUM-ALBUTEROL 0.5-2.5 (3) MG/3ML IN SOLN
3.0000 mL | RESPIRATORY_TRACT | Status: DC
  Administered 2013-11-02 (×2): 3 mL via RESPIRATORY_TRACT
  Filled 2013-11-02 (×2): qty 3

## 2013-11-02 NOTE — Evaluation (Signed)
Physical Therapy Evaluation Patient Details Name: Amber [name] MRN: [id number] DOB: 02/02/1926 Today's Date: 11/02/2013 Time: 1640-1710 PT Time Calculation (min): 30 min  PT Assessment / Plan / Recommendation History of Present Illness  Amber [name] is a 79 y.o. female , lives alone, initially presented to urgent care with a cough. She was found to have a fever of 101.3. She was subsequently referred to the emergency room and reportedly had wheezing so was referred for observation for possible influenza and asthma exacerbation. Since then her flu swab has come back negative. She reports that the cough has been worsening over the past 3 weeks. She has a history of asthma and has been taking inhalers at home. She also reports rhinorrhea, mild weakness, some sinus pressure, postnasal drip. No vomiting or diarrhea. No myalgias. She usually gets around with a cane. Her daughter lives next door. Chest x-ray shows nothing acute. Labs significant only for hypokalemia. Oxygen saturations normal. Patient feels that if she could get some rest she might feel better. Spent the night in the emergency room. No sick contacts.  Clinical Impression  Pt admitted with the above problems.  She presents with weakness and general deconditioning, but mobilized fairly well.  I suspect pt will be able to get home with available family assist, but will benefit from HHPT to maximize Independence and function.    PT Assessment  Patient needs continued PT services    Follow Up Recommendations  Home health PT;Supervision for mobility/OOB (initially hope family will be available more often)    Does the patient have the potential to tolerate intense rehabilitation      Barriers to Discharge Decreased caregiver support      Equipment Recommendations  None recommended by PT    Recommendations for Other Services     Frequency Min 3X/week    Precautions / Restrictions Precautions Precautions: Fall   Pertinent  Vitals/Pain       Mobility  Bed Mobility Bed Mobility: Supine to Sit;Sitting - Scoot to Edge of Bed Supine to Sit: 4: Min assist;With rails;HOB elevated Sitting - Scoot to Edge of Bed: 5: Supervision;With rail Details for Bed Mobility Assistance: heavy use of rail Transfers Transfers: Sit to Stand;Stand to Sit Sit to Stand: 4: Min assist;3: Mod assist;With upper extremity assist;From bed;From toilet (mod from lower surface) Stand to Sit: 4: Min assist;With upper extremity assist;To chair/3-in-1;To toilet Details for Transfer Assistance: lifting and assist to come forward Ambulation/Gait Ambulation/Gait Assistance: 4: Min guard Ambulation Distance (Feet): 110 Feet Assistive device: Straight cane Ambulation/Gait Assistance Details: very slow cadence with short equal step length Gait Pattern: Step-through pattern Stairs: No    Exercises     PT Diagnosis: Generalized weakness;Difficulty walking  PT Problem List: Decreased strength;Decreased activity tolerance;Decreased balance;Decreased mobility;Cardiopulmonary status limiting activity PT Treatment Interventions: Gait training;Stair training;Functional mobility training;Therapeutic activities;Patient/family education     PT Goals(Current goals can be found in the care plan section) Acute Rehab PT Goals Patient Stated Goal: Get back home safely PT Goal Formulation: With patient Time For Goal Achievement: 11/16/13 Potential to Achieve Goals: Good  Visit Information  Last PT Received On: 11/02/13 Assistance Needed: +1 History of Present Illness: Amber [name] is a 79 y.o. female , lives alone, initially presented to urgent care with a cough. She was found to have a fever of 101.3. She was subsequently referred to the emergency room and reportedly had wheezing so was referred for observation for possible influenza and asthma exacerbation. Since then her  flu swab has come back negative. She reports that the cough has been  worsening over the past 3 weeks. She has a history of asthma and has been taking inhalers at home. She also reports rhinorrhea, mild weakness, some sinus pressure, postnasal drip. No vomiting or diarrhea. No myalgias. She usually gets around with a cane. Her daughter lives next door. Chest x-ray shows nothing acute. Labs significant only for hypokalemia. Oxygen saturations normal. Patient feels that if she could get some rest she might feel better. Spent the night in the emergency room. No sick contacts.       Prior Functioning  Home Living Family/patient expects to be discharged to:: Private residence Living Arrangements: Alone Available Help at Discharge: Family;Available PRN/intermittently Home Access: Stairs to enter Entrance Stairs-Number of Steps: 3 Entrance Stairs-Rails: Right;Left Home Layout: One level Home Equipment: Walker - 2 wheels;Cane - single point Prior Function Level of Independence: Independent with assistive device(s) Communication Communication: No difficulties    Cognition  Cognition Arousal/Alertness: Awake/alert Behavior During Therapy: WFL for tasks assessed/performed Overall Cognitive Status: Within Functional Limits for tasks assessed    Extremity/Trunk Assessment Upper Extremity Assessment Upper Extremity Assessment: Overall WFL for tasks assessed;Generalized weakness Lower Extremity Assessment Lower Extremity Assessment: Generalized weakness   Balance Balance Balance Assessed: Yes Static Sitting Balance Static Sitting - Balance Support: No upper extremity supported;Feet supported Static Sitting - Level of Assistance: 7: Independent Static Standing Balance Static Standing - Balance Support: Right upper extremity supported;Left upper extremity supported;During functional activity Static Standing - Level of Assistance: 5: Stand by assistance  End of Session PT - End of Session Equipment Utilized During Treatment: Gait belt Activity Tolerance: Patient  tolerated treatment well Patient left: in chair;with call bell/phone within reach;with family/visitor present Nurse Communication: Mobility status  GP     Lanard Arguijo, Eliseo Gum 11/02/2013, 5:28 PM 11/02/2013  Naturita Bing, PT [phone]  (pager)

## 2013-11-02 NOTE — Care Management Note (Unsigned)
    Page 1 of 1   11/02/2013     4:34:44 PM   CARE MANAGEMENT NOTE 11/02/2013  Patient:  Amber [name], Amber [name]   Account Number:  [id number]  Date Initiated:  11/02/2013  Documentation initiated by:  Roslind Michaux  Subjective/Objective Assessment:   PT ADM ON 12/29 WITH ASTHMA EXACERBATION.  PTA, PT LIVES ALONE AND IS INDEPENDENT; DAUGHTER LIVES NEXT DOOR.     Action/Plan:   WILL CONT TO FOLLOW FOR DC NEEDS AS PT PROGRESSES.   Anticipated DC Date:  11/04/2013   Anticipated DC Plan:  HOME/SELF CARE      DC Planning Services  CM consult      Choice offered to / List presented to:             Status of service:  In process, will continue to follow Medicare Important Message given?   (If response is "NO", the following Medicare IM given date fields will be blank) Date Medicare IM given:   Date Additional Medicare IM given:    Discharge Disposition:    Per UR Regulation:  Reviewed for med. necessity/level of care/duration of stay  If discussed at Long Length of Stay Meetings, dates discussed:    Comments:

## 2013-11-02 NOTE — Progress Notes (Signed)
UR completed. Patient changed to inpatient r/t requiring IV antibiotics and IVF @ 125cc/hr  

## 2013-11-02 NOTE — Progress Notes (Signed)
[facility] PROGRESS NOTE  SHANETA CERVENKA ZOX:[id number] DOB: 1925/12/21 DOA: 11/01/2013 PCP: [name], MD  Assessment/Plan: Asthma exacerbation: Continue bronchodilators and hold off on steroids for now.   Acute bronchitis: Flu swab negative. Cont Levaquin which will cover bronchitis and sinusitis. Mucinex DM.  Acute sinusitis: Flonase.   Cancer of upper-outer quadrant of female breast   Unspecified hypothyroidism   Essential hypertension, benig: Hold medications for now.   Hypokalemia: Replete. Check magnesium.   DM (diabetes mellitus), type 2: Not on medications. Will monitor blood glucose   Code Status: Full Family Communication: Pt in room (indicate person spoken with, relationship, and if by phone, the number) Disposition Plan: Pending  Antibiotics:  Levaquin 11/01/13>>  HPI/Subjective: Continues to complain of cough and wheezing.  Objective: Filed Vitals:   11/01/13 1519 11/01/13 2020 11/01/13 2043 11/02/13 0615  BP:   123/70   Pulse:   86   Temp:   98.7 F (37.1 C)   TempSrc:   Oral   Resp:   19   Weight:    91.8 kg (202 lb 6.1 oz)  SpO2: 99% 99% 98%     Intake/Output Summary (Last 24 hours) at 11/02/13 1327 Last data filed at 11/02/13 0900  Gross per 24 hour  Intake   1740 ml  Output      0 ml  Net   1740 ml   Filed Weights   11/02/13 0615  Weight: 91.8 kg (202 lb 6.1 oz)    Exam:   General:  Awake, in nad  Cardiovascular: regular, s1, s2  Respiratory:  Normal resp effort, end-expiratory wheezing  Abdomen: soft, nondistended  Musculoskeletal: perfused, no clubbing   Data Reviewed: Basic Metabolic Panel:  Recent Labs Lab 11/01/13 0056 11/02/13 0540  NA 144 144  K 2.9* 4.0  CL 100 107  CO2 31 28  GLUCOSE 170* 116*  BUN 14 11  CREATININE 1.00 0.87  CALCIUM 9.2 8.5  MG  --  1.9   Liver Function Tests: No results found for this basename: AST, ALT, ALKPHOS, BILITOT, PROT, ALBUMIN,  in the last 168  hours No results found for this basename: LIPASE, AMYLASE,  in the last 168 hours No results found for this basename: AMMONIA,  in the last 168 hours CBC:  Recent Labs Lab 11/01/13 0056  WBC 5.9  NEUTROABS 4.3  HGB 13.5  HCT 39.5  MCV 92.7  PLT 149*   Cardiac Enzymes: No results found for this basename: CKTOTAL, CKMB, CKMBINDEX, TROPONINI,  in the last 168 hours BNP (last 3 results) No results found for this basename: PROBNP,  in the last 8760 hours CBG:  Recent Labs Lab 11/01/13 1811  GLUCAP 129*    No results found for this or any previous visit (from the past 240 hour(s)).   Studies: Dg Chest 2 View  10/31/2013   CLINICAL DATA:  Cough and fever  EXAM: CHEST  2 VIEW  COMPARISON:  November 18, 2012  FINDINGS: The heart size and mediastinal contours are stable. There is chronic prominence of opacity of the right hilum and infrahilar region unchanged compared to prior exam. There is no pulmonary edema or pleural effusion. The visualized skeletal structures are stable.  IMPRESSION: No active cardiopulmonary disease. Stable chronic changes of the right hilar and infrahilar region.   Electronically Signed   By: Sherian Rein M.D.   On: 10/31/2013 18:04    Scheduled Meds: . aspirin  81 mg Oral Daily  . enoxaparin (LOVENOX) injection  40 mg Subcutaneous Q24H  . fluticasone  2 spray Each Nare Daily  . letrozole  2.5 mg Oral Daily  . levofloxacin (LEVAQUIN) IV  500 mg Intravenous Q24H  . levothyroxine  150 mcg Oral QAC breakfast  . mometasone-formoterol  2 puff Inhalation BID  . potassium chloride SA  20 mEq Oral BID  . simvastatin  20 mg Oral q1800   Continuous Infusions: . 0.9 % NaCl with KCl 40 mEq / L 125 mL/hr at 11/02/13 1044    Principal Problem:   Asthma exacerbation Active Problems:   Cancer of upper-outer quadrant of female breast   Acute bronchitis   Acute sinusitis   Unspecified hypothyroidism   Essential hypertension, benign   Hypokalemia   DM (diabetes  mellitus), type 2  Time spent:  CHIU, STEPHEN K  [facility] Pager [phone]. If 7PM-7AM, please contact night-coverage at [email or web address], password [facility] 11/02/2013, 1:27 PM  LOS: 1 day

## 2013-11-03 LAB — GLUCOSE, CAPILLARY
GLUCOSE-CAPILLARY: 109 mg/dL — AB (ref 70–99)
GLUCOSE-CAPILLARY: 121 mg/dL — AB (ref 70–99)
GLUCOSE-CAPILLARY: 146 mg/dL — AB (ref 70–99)
Glucose-Capillary: 106 mg/dL — ABNORMAL HIGH (ref 70–99)

## 2013-11-03 MED ORDER — LEVOFLOXACIN 500 MG PO TABS
500.0000 mg | ORAL_TABLET | ORAL | Status: DC
  Administered 2013-11-03 – 2013-11-04 (×2): 500 mg via ORAL
  Filled 2013-11-03 (×3): qty 1

## 2013-11-03 NOTE — Progress Notes (Signed)
[facility] PROGRESS NOTE  Amber [name] RCV:[id number] DOB: 1926/10/28 DOA: 11/01/2013 PCP: [name], MD  Assessment/Plan: Asthma exacerbation: Continue bronchodilators and steroids. Improved today, however, pt continues to report slight difficulty breathing. Trace end-expiratory wheezing noted.  Acute bronchitis: Flu swab negative. Cont Levaquin which will cover bronchitis and sinusitis. Mucinex DM.  Acute sinusitis: Flonase.   Cancer of upper-outer quadrant of female breast   Unspecified hypothyroidism   Essential hypertension, benig: Hold medications for now.   Hypokalemia: Repleted  DM (diabetes mellitus), type 2: Not on medications. Will monitor blood glucose   Code Status: Full Family Communication: Pt in room (indicate person spoken with, relationship, and if by phone, the number) Disposition Plan: Poss d/c soon to home w/ HH/PT  Antibiotics:  Levaquin 11/01/13>>  HPI/Subjective: Feels better, but still mildly sob  Objective: Filed Vitals:   11/03/13 0906 11/03/13 1352 11/03/13 1500 11/03/13 1610  BP:   166/73   Pulse: 80 88 97 98  Temp:   97.6 F (36.4 C)   TempSrc:   Oral   Resp: 18 18 20 18   Height:  5\' 2"  (1.575 m)    Weight:  94.2 kg (207 lb 10.8 oz)    SpO2:    100%    Intake/Output Summary (Last 24 hours) at 11/03/13 1628 Last data filed at 11/03/13 0730  Gross per 24 hour  Intake 1242.08 ml  Output      0 ml  Net 1242.08 ml   Filed Weights   11/02/13 0615 11/03/13 0347 11/03/13 1352  Weight: 91.8 kg (202 lb 6.1 oz) 93.668 kg (206 lb 8 oz) 94.2 kg (207 lb 10.8 oz)    Exam:   General:  Awake, in nad  Cardiovascular: regular, s1, s2  Respiratory:  Normal resp effort, trace end-expiratory wheezing  Abdomen: soft, nondistended  Musculoskeletal: perfused, no clubbing   Data Reviewed: Basic Metabolic Panel:  Recent Labs Lab 11/01/13 0056 11/02/13 0540  NA 144 144  K 2.9* 4.0  CL 100 107  CO2 31 28   GLUCOSE 170* 116*  BUN 14 11  CREATININE 1.00 0.87  CALCIUM 9.2 8.5  MG  --  1.9   Liver Function Tests: No results found for this basename: AST, ALT, ALKPHOS, BILITOT, PROT, ALBUMIN,  in the last 168 hours No results found for this basename: LIPASE, AMYLASE,  in the last 168 hours No results found for this basename: AMMONIA,  in the last 168 hours CBC:  Recent Labs Lab 11/01/13 0056  WBC 5.9  NEUTROABS 4.3  HGB 13.5  HCT 39.5  MCV 92.7  PLT 149*   Cardiac Enzymes: No results found for this basename: CKTOTAL, CKMB, CKMBINDEX, TROPONINI,  in the last 168 hours BNP (last 3 results) No results found for this basename: PROBNP,  in the last 8760 hours CBG:  Recent Labs Lab 11/01/13 1811 11/03/13 0647 11/03/13 1138  GLUCAP 129* 109* 121*    No results found for this or any previous visit (from the past 240 hour(s)).   Studies: No results found.  Scheduled Meds: . aspirin  81 mg Oral Daily  . enoxaparin (LOVENOX) injection  40 mg Subcutaneous Q24H  . fluticasone  2 spray Each Nare Daily  . ipratropium-albuterol  3 mL Nebulization QID  . letrozole  2.5 mg Oral Daily  . levofloxacin  500 mg Oral Q24H  . levothyroxine  150 mcg Oral QAC breakfast  . mometasone-formoterol  2 puff Inhalation BID  . potassium chloride SA  20 mEq Oral BID  . predniSONE  60 mg Oral Q breakfast  . simvastatin  20 mg Oral q1800   Continuous Infusions: . 0.9 % NaCl with KCl 40 mEq / L 125 mL/hr at 11/03/13 1340    Principal Problem:   Asthma exacerbation Active Problems:   Cancer of upper-outer quadrant of female breast   Acute bronchitis   Acute sinusitis   Unspecified hypothyroidism   Essential hypertension, benign   Hypokalemia   DM (diabetes mellitus), type 2  Time spent: 86min  CHIU, [facility] Pager [phone]. If 7PM-7AM, please contact night-coverage at [email or web address], password [facility] 11/03/2013, 4:28 PM  LOS: 2 days

## 2013-11-03 NOTE — Progress Notes (Signed)
This patient is receiving Levaquin by the IV route. Based on criteria approved by the Pharmacy and Therapeutics Committee, this medication is being converted to the equivalent oral dose form. These criteria include:   . The patient is eating (either orally or per tube) and/or has been taking other orally administered medications for at least 24 hours.  . This patient has no evidence of active gastrointestinal bleeding or impaired GI absorption (gastrectomy, short bowel, patient on TNA or NPO).   If you have questions about this conversion, please contact the pharmacy department.  [employer], Pharm.D., BCPS Clinical Pharmacist Pager [phone] 11/03/2013 2:04 PM

## 2013-11-04 LAB — GLUCOSE, CAPILLARY
GLUCOSE-CAPILLARY: 108 mg/dL — AB (ref 70–99)
GLUCOSE-CAPILLARY: 169 mg/dL — AB (ref 70–99)
Glucose-Capillary: 106 mg/dL — ABNORMAL HIGH (ref 70–99)
Glucose-Capillary: 83 mg/dL (ref 70–99)
Glucose-Capillary: 143 mg/dL — ABNORMAL HIGH (ref 70–99)

## 2013-11-04 LAB — BASIC METABOLIC PANEL
BUN: 10 mg/dL (ref 6–23)
CHLORIDE: 105 meq/L (ref 96–112)
CO2: 22 meq/L (ref 19–32)
Calcium: 9.4 mg/dL (ref 8.4–10.5)
Creatinine, Ser: 0.79 mg/dL (ref 0.50–1.10)
GFR calc Af Amer: 86 mL/min — ABNORMAL LOW (ref >90)
GFR calc non Af Amer: 74 mL/min — ABNORMAL LOW (ref >90)
GLUCOSE: 98 mg/dL (ref 70–99)
POTASSIUM: 4.4 meq/L (ref 3.7–5.3)
SODIUM: 141 meq/L (ref 137–147)

## 2013-11-04 MED ORDER — PREDNISONE 20 MG PO TABS
40.0000 mg | ORAL_TABLET | Freq: Every day | ORAL | Status: DC
  Administered 2013-11-05: 40 mg via ORAL
  Filled 2013-11-04 (×2): qty 2

## 2013-11-04 NOTE — Progress Notes (Signed)
[facility] PROGRESS NOTE  Amber [name] PPJ:[id number] DOB: July 30, 1926 DOA: 11/01/2013 PCP: [name], MD  Assessment/Plan: Asthma exacerbation: Continue bronchodilators and steroids. Improved today, however, pt continues to report slight difficulty breathing. Trace end-expiratory wheezing noted.Continue steriod taper.  Acute bronchitis: Flu swab negative. Cont Levaquin which will cover bronchitis and sinusitis. Mucinex DM.  Acute sinusitis: Flonase.   Cancer of upper-outer quadrant of female breast   Unspecified hypothyroidism   Essential hypertension, benig: Hold medications for now.   Hypokalemia: Repleted  DM (diabetes mellitus), type 2: Not on medications. Will monitor blood glucose   Code Status: Full Family Communication: Pt in room and daughter(indicate person spoken with, relationship, and if by phone, the number) Disposition Plan: Poss d/c soon to home w/ HH/PT  Antibiotics:  Levaquin 11/01/13>>  HPI/Subjective: Feels better, but still mildly sob.  Objective: Filed Vitals:   11/03/13 2023 11/03/13 2055 11/04/13 0411 11/04/13 0914  BP: 125/68  137/60   Pulse: 91  75   Temp: 98.1 F (36.7 C)  98.4 F (36.9 C)   TempSrc: Oral  Oral   Resp: 18  20   Height:      Weight:      SpO2: 99% 96% 100% 99%    Intake/Output Summary (Last 24 hours) at 11/04/13 1403 Last data filed at 11/04/13 0800  Gross per 24 hour  Intake 4637.92 ml  Output    500 ml  Net 4137.92 ml   Filed Weights   11/02/13 0615 11/03/13 0347 11/03/13 1352  Weight: 91.8 kg (202 lb 6.1 oz) 93.668 kg (206 lb 8 oz) 94.2 kg (207 lb 10.8 oz)    Exam:   General:  Awake, in nad  Cardiovascular: regular, s1, s2  Respiratory:  Normal resp effort, min end-expiratory wheezing  Abdomen: soft, nondistended  Musculoskeletal: perfused, no clubbing   Data Reviewed: Basic Metabolic Panel:  Recent Labs Lab 11/01/13 0056 11/02/13 0540 11/04/13 1010  NA 144 144 141  K  2.9* 4.0 4.4  CL 100 107 105  CO2 31 28 22   GLUCOSE 170* 116* 98  BUN 14 11 10   CREATININE 1.00 0.87 0.79  CALCIUM 9.2 8.5 9.4  MG  --  1.9  --    Liver Function Tests: No results found for this basename: AST, ALT, ALKPHOS, BILITOT, PROT, ALBUMIN,  in the last 168 hours No results found for this basename: LIPASE, AMYLASE,  in the last 168 hours No results found for this basename: AMMONIA,  in the last 168 hours CBC:  Recent Labs Lab 11/01/13 0056  WBC 5.9  NEUTROABS 4.3  HGB 13.5  HCT 39.5  MCV 92.7  PLT 149*   Cardiac Enzymes: No results found for this basename: CKTOTAL, CKMB, CKMBINDEX, TROPONINI,  in the last 168 hours BNP (last 3 results) No results found for this basename: PROBNP,  in the last 8760 hours CBG:  Recent Labs Lab 11/03/13 1630 11/03/13 2218 11/04/13 0616 11/04/13 1119 11/04/13 1156  GLUCAP 146* 106* 106* 83 108*    No results found for this or any previous visit (from the past 240 hour(s)).   Studies: No results found.  Scheduled Meds: . aspirin  81 mg Oral Daily  . enoxaparin (LOVENOX) injection  40 mg Subcutaneous Q24H  . fluticasone  2 spray Each Nare Daily  . ipratropium-albuterol  3 mL Nebulization QID  . letrozole  2.5 mg Oral Daily  . levofloxacin  500 mg Oral Q24H  . levothyroxine  150 mcg Oral QAC  breakfast  . mometasone-formoterol  2 puff Inhalation BID  . predniSONE  60 mg Oral Q breakfast  . simvastatin  20 mg Oral q1800   Continuous Infusions:    Principal Problem:   Asthma exacerbation Active Problems:   Cancer of upper-outer quadrant of female breast   Acute bronchitis   Acute sinusitis   Unspecified hypothyroidism   Essential hypertension, benign   Hypokalemia   DM (diabetes mellitus), type 2  Time spent: 73min  [facility] MD [facility] Pager [phone]. If 7PM-7AM, please contact night-coverage at [email or web address], password [facility] 11/04/2013, 2:03 PM  LOS: 3 days

## 2013-11-04 NOTE — Progress Notes (Signed)
Physical Therapy Treatment Patient Details Name: Amber [name] MRN: [id number] DOB: Apr 26, 1926 Today's Date: 11/04/2013 Time: 5102-5852 PT Time Calculation (min): 34 min  PT Assessment / Plan / Recommendation  History of Present Illness Amber [name] is a 80 y.o. female , lives alone, initially presented to urgent care with a cough. She was found to have a fever of 101.3. She was subsequently referred to the emergency room and reportedly had wheezing so was referred for observation for possible influenza and asthma exacerbation. Since then her flu swab has come back negative. She reports that the cough has been worsening over the past 3 weeks. She has a history of asthma and has been taking inhalers at home. She also reports rhinorrhea, mild weakness, some sinus pressure, postnasal drip. No vomiting or diarrhea. No myalgias. She usually gets around with a cane. Her daughter lives next door. Chest x-ray shows nothing acute. Labs significant only for hypokalemia. Oxygen saturations normal. Patient feels that if she could get some rest she might feel better. Spent the night in the emergency room. No sick contacts.   PT Comments   Pt admitted with above. Pt currently with functional limitations due to balance and endurance deficits. Pt will benefit from skilled PT to increase their independence and safety with mobility to allow discharge to the venue listed below.  Follow Up Recommendations  Home health PT;Supervision for mobility/OOB                 Equipment Recommendations  None recommended by PT        Frequency Min 3X/week   Progress towards PT Goals Progress towards PT goals: Progressing toward goals  Plan Current plan remains appropriate    Precautions / Restrictions Precautions Precautions: Fall Restrictions Weight Bearing Restrictions: No   Pertinent Vitals/Pain VSS, no pain    Mobility  Bed Mobility Bed Mobility: Supine to Sit;Sitting - Scoot to Edge of Bed;Sit to  Supine Supine to Sit: 4: Min assist;With rails;HOB elevated Sitting - Scoot to Edge of Bed: 5: Supervision;With rail Sit to Supine: 4: Min assist;HOB flat Details for Bed Mobility Assistance: heavy use of rail Transfers Transfers: Sit to Stand;Stand to Sit Sit to Stand: 4: Min assist;With upper extremity assist;From bed Stand to Sit: 4: Min assist;With upper extremity assist;To bed Details for Transfer Assistance: lifting and assist to come forward Ambulation/Gait Ambulation/Gait Assistance: 4: Min guard Ambulation Distance (Feet): 155 Feet Assistive device: Straight cane Ambulation/Gait Assistance Details: Pt with very slow cadence taking a long time to ambulate 155 feet.  Pt reaching for furniture/sink/rail in hallway with hand that cane was not in.  Pt kept switching cane from one hand to the other depending on what there was in the environment to hand onto.  Pt shaky at times.  Needing min guard at all times for safety.  No LOB but no challenges given either.   Gait Pattern: Step-through pattern Gait velocity: decreased Stairs: No Wheelchair Mobility Wheelchair Mobility: No    PT Goals (current goals can now be found in the care plan section)    Visit Information  Last PT Received On: 11/04/13 Assistance Needed: +1 History of Present Illness: Amber [name] is a 80 y.o. female , lives alone, initially presented to urgent care with a cough. She was found to have a fever of 101.3. She was subsequently referred to the emergency room and reportedly had wheezing so was referred for observation for possible influenza and asthma exacerbation. Since then her flu swab  has come back negative. She reports that the cough has been worsening over the past 3 weeks. She has a history of asthma and has been taking inhalers at home. She also reports rhinorrhea, mild weakness, some sinus pressure, postnasal drip. No vomiting or diarrhea. No myalgias. She usually gets around with a cane. Her daughter  lives next door. Chest x-ray shows nothing acute. Labs significant only for hypokalemia. Oxygen saturations normal. Patient feels that if she could get some rest she might feel better. Spent the night in the emergency room. No sick contacts.    Subjective Data  Subjective: "I feel like I could walk to the door."   Cognition  Cognition Arousal/Alertness: Awake/alert Behavior During Therapy: WFL for tasks assessed/performed Overall Cognitive Status: Within Functional Limits for tasks assessed    Balance  Static Sitting Balance Static Sitting - Balance Support: No upper extremity supported;Feet supported Static Sitting - Level of Assistance: 7: Independent Static Sitting - Comment/# of Minutes: 2 Static Standing Balance Static Standing - Balance Support: Right upper extremity supported;Left upper extremity supported;During functional activity Static Standing - Level of Assistance: 5: Stand by assistance Static Standing - Comment/# of Minutes: 2  End of Session PT - End of Session Equipment Utilized During Treatment: Gait belt Activity Tolerance: Patient tolerated treatment well Patient left: in bed;with call bell/phone within reach;with family/visitor present;with bed alarm set Nurse Communication: Mobility status        INGOLD,Analis Distler 11/04/2013, 1:47 PM [facility] [phone] (pager)

## 2013-11-05 LAB — BASIC METABOLIC PANEL
BUN: 14 mg/dL (ref 6–23)
CALCIUM: 9.5 mg/dL (ref 8.4–10.5)
CO2: 27 mEq/L (ref 19–32)
CREATININE: 0.86 mg/dL (ref 0.50–1.10)
Chloride: 104 mEq/L (ref 96–112)
GFR calc non Af Amer: 60 mL/min — ABNORMAL LOW (ref >90)
GFR, EST AFRICAN AMERICAN: 69 mL/min — AB (ref >90)
Glucose, Bld: 99 mg/dL (ref 70–99)
Potassium: 4.4 mEq/L (ref 3.7–5.3)
Sodium: 141 mEq/L (ref 137–147)

## 2013-11-05 LAB — GLUCOSE, CAPILLARY
Glucose-Capillary: 91 mg/dL (ref 70–99)
Glucose-Capillary: 146 mg/dL — ABNORMAL HIGH (ref 70–99)

## 2013-11-05 MED ORDER — IPRATROPIUM-ALBUTEROL 0.5-2.5 (3) MG/3ML IN SOLN: 3.0000 mL | Freq: Two times a day (BID) | RESPIRATORY_TRACT | Status: DC

## 2013-11-05 MED ORDER — ALBUTEROL SULFATE HFA 108 (90 BASE) MCG/ACT IN AERS: 1.0000 | INHALATION_SPRAY | RESPIRATORY_TRACT | Status: DC | PRN

## 2013-11-05 MED ORDER — FLUTICASONE PROPIONATE 50 MCG/ACT NA SUSP: 2.0000 | Freq: Every day | NASAL | Status: DC

## 2013-11-05 MED ORDER — LEVOFLOXACIN 500 MG PO TABS: 500.0000 mg | ORAL_TABLET | ORAL | Status: AC

## 2013-11-05 MED ORDER — GUAIFENESIN ER 600 MG PO TB12: 1200.0000 mg | ORAL_TABLET | Freq: Two times a day (BID) | ORAL | Status: DC

## 2013-11-05 MED ORDER — ALBUTEROL SULFATE (2.5 MG/3ML) 0.083% IN NEBU: 2.5000 mg | INHALATION_SOLUTION | RESPIRATORY_TRACT | Status: DC | PRN

## 2013-11-05 MED ORDER — PREDNISONE 20 MG PO TABS: 40.0000 mg | ORAL_TABLET | Freq: Every day | ORAL | Status: DC

## 2013-11-05 NOTE — Discharge Summary (Signed)
Physician Discharge Summary  Amber [name] [id number] DOB: May 29, 1926 DOA: 11/01/2013  PCP: Amber Bolt, MD  Admit date: 11/01/2013 Discharge date: 11/05/2013  Time spent: 65 minutes  Recommendations for Outpatient Follow-up:  1. Follow up with Amber Bolt, MD in 1 week. All followup basic metabolic profile and a tibial pin to followup on electrolytes and renal function. Patient's acute asthma exacerbation the to be reassessed at that time.  Discharge Diagnoses:  Principal Problem:   Asthma exacerbation Active Problems:   Cancer of upper-outer quadrant of female breast   Acute bronchitis   Acute sinusitis   Unspecified hypothyroidism   Essential hypertension, benign   Hypokalemia   DM (diabetes mellitus), type 2   Discharge Condition: Stable and improved  Diet recommendation: Carb modified  Filed Weights   11/02/13 0615 11/03/13 0347 11/03/13 1352  Weight: 91.8 kg (202 lb 6.1 oz) 93.668 kg (206 lb 8 oz) 94.2 kg (207 lb 10.8 oz)    History of present illness:  Amber [name] is a 80 y.o. female , lives alone, initially presented to urgent care with a cough. She was found to have a fever of 101.3. She was subsequently referred to the emergency room and reportedly had wheezing so was referred for observation for possible influenza and asthma exacerbation. Since then her flu swab has come back negative. She reports that the cough has been worsening over the past 3 weeks. She has a history of asthma and has been taking inhalers at home. She also reports rhinorrhea, mild weakness, some sinus pressure, postnasal drip. No vomiting or diarrhea. No myalgias. She usually gets around with a cane. Her daughter lives next door. Chest x-ray shows nothing acute. Labs significant only for hypokalemia. Oxygen saturations normal. Patient feels that if she could get some rest she might feel better. Spent the night in the emergency room. No sick contacts   Hospital Course:   #1 acute asthma exacerbation Patient presented noted to have a fever of 101.3, wheezing, shortness of breath and concern for an acute asthma exacerbation. Flu swab was done which came back negative. Chest x-ray which was done was negative for any acute infiltrates. Patient was placed on IV steroids, antibiotics, nebulizer treatments with clinical improvement. Patient's IV steroids was subsequently tapered to oral steroids and patient will be discharged on a steroid taper. Patient will also be discharged on 4 more days of oral Levaquin to complete a one-week course of above therapy. Patient be discharged in stable and improved condition.  #2 probable acute bronchitis Patient at present with fever and cough. Flu swab which was done was negative. Patient was maintained on antibiotics as well as breathing treatments and supportive care. Patient will be discharged on 4 more days of oral Levaquin to complete 1 week course of antibiotic therapy.  #3 hypertension Patient blood pressure medications were held during the hospitalization we resumed on discharge.  #4 hypokalemia Repleted.  #5 type 2 diabetes Patient was not on any medications for diabetes at home was more managed with dietary changes. Patient will followup with PCP as outpatient.  Procedures:  None  Consultations:  None  Discharge Exam: Filed Vitals:   11/05/13 0427  BP: 137/69  Pulse: 70  Temp: 98.2 F (36.8 C)  Resp: 20    General: NAD Cardiovascular: RRR Respiratory: SCATTERED COARSE BS  Discharge Instructions  Discharge Orders   Future Appointments Provider Department Dept Phone   01/02/2014 9:30 AM [facility] [place]  01/02/2014 10:00 AM Deatra Robinson, MD [place] [phone]   Future Orders Complete By Expires   Diet Carb Modified  As directed    Discharge instructions  As directed    Comments:     Follow up with  Amber Bolt, MD in 1 week.   Increase activity slowly  As directed        Medication List         ADVAIR DISKUS 250-50 MCG/DOSE Aepb  Generic drug:  Fluticasone-Salmeterol  Inhale 1 puff into the lungs 2 (two) times daily.     albuterol 108 (90 BASE) MCG/ACT inhaler  Commonly known as:  PROAIR HFA  Inhale 1-2 puffs into the lungs every 4 (four) hours as needed for wheezing or shortness of breath. Use 2 puffs 3 times daily x 4 days then use as needed.     amLODipine 5 MG tablet  Commonly known as:  NORVASC  Take 5 mg by mouth Daily.     aspirin 81 MG tablet  Take 81 mg by mouth daily.     cholecalciferol 1000 UNITS tablet  Commonly known as:  VITAMIN D  Take 1 tablet (1,000 Units total) by mouth daily.     diazepam 5 MG tablet  Commonly known as:  VALIUM  Three times daily as needed.     fluticasone 50 MCG/ACT nasal spray  Commonly known as:  FLONASE  Place 2 sprays into both nostrils daily.     furosemide 80 MG tablet  Commonly known as:  LASIX  Take 80 mg by mouth Daily.     guaiFENesin 600 MG 12 hr tablet  Commonly known as:  MUCINEX  Take 2 tablets (1,200 mg total) by mouth 2 (two) times daily. Take for 5 days then stop.     HYDROcodone-acetaminophen 5-325 MG per tablet  Commonly known as:  NORCO/VICODIN  Take 1 tablet by mouth every 4 (four) hours as needed.     letrozole 2.5 MG tablet  Commonly known as:  FEMARA  Take 1 tablet (2.5 mg total) by mouth daily.     levofloxacin 500 MG tablet  Commonly known as:  LEVAQUIN  Take 1 tablet (500 mg total) by mouth daily. Take for 4 days then stop.     potassium chloride SA 20 MEQ tablet  Commonly known as:  K-DUR,KLOR-CON  Take 20 mEq by mouth daily.     pravastatin 40 MG tablet  Commonly known as:  PRAVACHOL  Take 40 mg by mouth Daily.     predniSONE 20 MG tablet  Commonly known as:  DELTASONE  Take 2 tablets (40 mg total) by mouth daily with breakfast. Take 2 tablets (40mg ) daily x 3 days, then 1  tablet (20mg ) daily x 3 days then stop.     promethazine 25 MG tablet  Commonly known as:  PHENERGAN  Take 25 mg by mouth every 12 (twelve) hours as needed. Take 1/2 or 1 whole table per dosage as needed.     propranolol 40 MG tablet  Commonly known as:  INDERAL  Take 40 mg by mouth Daily.     SYNTHROID 150 MCG tablet  Generic drug:  levothyroxine  Take 150 mcg by mouth Daily.     traMADol 50 MG tablet  Commonly known as:  ULTRAM  Every 6 hours as needed.       No Known Allergies     Follow-up Information   Follow up with Amber Bolt, MD.  Schedule an appointment as soon as possible for a visit in 1 week.   Specialty:  Endocrinology   Contact information:   [address] [place] [address] [phone]        The results of significant diagnostics from this hospitalization (including imaging, microbiology, ancillary and laboratory) are listed below for reference.    Significant Diagnostic Studies: Dg Chest 2 View  10/31/2013   CLINICAL DATA:  Cough and fever  EXAM: CHEST  2 VIEW  COMPARISON:  November 18, 2012  FINDINGS: The heart size and mediastinal contours are stable. There is chronic prominence of opacity of the right hilum and infrahilar region unchanged compared to prior exam. There is no pulmonary edema or pleural effusion. The visualized skeletal structures are stable.  IMPRESSION: No active cardiopulmonary disease. Stable chronic changes of the right hilar and infrahilar region.   Electronically Signed   By: Abelardo Diesel M.D.   On: 10/31/2013 18:04    Microbiology: No results found for this or any previous visit (from the past 240 hour(s)).   Labs: Basic Metabolic Panel:  Recent Labs Lab 11/01/13 0056 11/02/13 0540 11/04/13 1010 11/05/13 0523  NA 144 144 141 141  K 2.9* 4.0 4.4 4.4  CL 100 107 105 104  CO2 31 28 22 27   GLUCOSE 170* 116* 98 99  BUN 14 11 10 14   CREATININE 1.00 0.87 0.79 0.86  CALCIUM 9.2 8.5 9.4 9.5   MG  --  1.9  --   --    Liver Function Tests: No results found for this basename: AST, ALT, ALKPHOS, BILITOT, PROT, ALBUMIN,  in the last 168 hours No results found for this basename: LIPASE, AMYLASE,  in the last 168 hours No results found for this basename: AMMONIA,  in the last 168 hours CBC:  Recent Labs Lab 11/01/13 0056  WBC 5.9  NEUTROABS 4.3  HGB 13.5  HCT 39.5  MCV 92.7  PLT 149*   Cardiac Enzymes: No results found for this basename: CKTOTAL, CKMB, CKMBINDEX, TROPONINI,  in the last 168 hours BNP: BNP (last 3 results) No results found for this basename: PROBNP,  in the last 8760 hours CBG:  Recent Labs Lab 11/04/13 1119 11/04/13 1156 11/04/13 1606 11/04/13 2032 11/05/13 0634  GLUCAP 83 108* 143* 169* 91       Signed:  Patches Mcdonnell MD [facility] 11/05/2013, 11:22 AM

## 2013-11-05 NOTE — Progress Notes (Signed)
   CARE MANAGEMENT NOTE 11/05/2013  Patient:  Amber [name], Amber [name]   Account Number:  [id number]  Date Initiated:  11/02/2013  Documentation initiated by:  AMERSON,JULIE  Subjective/Objective Assessment:   PT ADM ON 12/29 WITH ASTHMA EXACERBATION.  PTA, PT LIVES ALONE AND IS INDEPENDENT; DAUGHTER LIVES NEXT DOOR.     Action/Plan:   WILL CONT TO FOLLOW FOR DC NEEDS AS PT PROGRESSES.   Anticipated DC Date:  11/04/2013   Anticipated DC Plan:  [place]  CM consult      [facility] Choice  HOME HEALTH   Choice offered to / List presented to:  C-1 Patient        [place] arranged  [place].   Status of service:  Completed, signed off Medicare Important Message given?   (If response is "NO", the following Medicare IM given date fields will be blank) Date Medicare IM given:   Date Additional Medicare IM given:    Discharge Disposition:  [place]  Per UR Regulation:  Reviewed for med. necessity/level of care/duration of stay  If discussed at [place] of Stay Meetings, dates discussed:    Comments:  11/05/13 08:50 CM spoke with pt in room for choice.  Pt chooses [facility] for PT[facility].  Pt states she uses her can and does not need any other equipment.  Address and contact numbers were verified.  Referral faxed to [facility] for HHPT/OT.  No other CM needs were communicated.  Mariane Masters, BSN, CM [phone].

## 2013-11-08 ENCOUNTER — Telehealth (INDEPENDENT_AMBULATORY_CARE_PROVIDER_SITE_OTHER): Payer: Self-pay | Admitting: *Deleted

## 2013-11-08 NOTE — Telephone Encounter (Signed)
Pts daughter Mariann Laster called in asking if an order can be placed to [facility] for pts yearly mammogram.  I have sent Alyse Low a message to see if she can order this.  Will schedule once ordered.

## 2013-11-11 ENCOUNTER — Other Ambulatory Visit (INDEPENDENT_AMBULATORY_CARE_PROVIDER_SITE_OTHER): Payer: Self-pay

## 2013-11-11 DIAGNOSIS — Z853 Personal history of malignant neoplasm of breast: Secondary | ICD-10-CM

## 2013-11-11 NOTE — Telephone Encounter (Signed)
Order received per Dr. Brantley Stage.  I am awaiting for his signature so the order can be faxed to [place].

## 2013-11-14 ENCOUNTER — Telehealth (INDEPENDENT_AMBULATORY_CARE_PROVIDER_SITE_OTHER): Payer: Self-pay | Admitting: *Deleted

## 2013-11-14 NOTE — Telephone Encounter (Signed)
I spoke with Amber [name] to inform her that Dr. Brantley Stage has placed an order for the pt to have her yearly MM done at [facility].  I informed her that the pt or one of her daughters may call [facility] to get this MM scheduled.  She is agreeable at this time.

## 2013-11-25 ENCOUNTER — Encounter (INDEPENDENT_AMBULATORY_CARE_PROVIDER_SITE_OTHER): Payer: Self-pay

## 2014-01-02 ENCOUNTER — Ambulatory Visit: Payer: Medicare PPO | Admitting: Oncology

## 2014-01-02 ENCOUNTER — Other Ambulatory Visit: Payer: Medicare PPO

## 2014-01-02 ENCOUNTER — Telehealth: Payer: Self-pay | Admitting: *Deleted

## 2014-01-02 ENCOUNTER — Ambulatory Visit (HOSPITAL_BASED_OUTPATIENT_CLINIC_OR_DEPARTMENT_OTHER): Payer: Medicare PPO | Admitting: Adult Health

## 2014-01-02 ENCOUNTER — Other Ambulatory Visit (HOSPITAL_BASED_OUTPATIENT_CLINIC_OR_DEPARTMENT_OTHER): Payer: Medicare PPO

## 2014-01-02 ENCOUNTER — Encounter: Payer: Self-pay | Admitting: Adult Health

## 2014-01-02 VITALS — BP 147/80 | HR 79 | Temp 98.3°F | Resp 18 | Ht 62.0 in | Wt 199.4 lb

## 2014-01-02 DIAGNOSIS — C50419 Malignant neoplasm of upper-outer quadrant of unspecified female breast: Secondary | ICD-10-CM

## 2014-01-02 DIAGNOSIS — M25559 Pain in unspecified hip: Secondary | ICD-10-CM

## 2014-01-02 DIAGNOSIS — E876 Hypokalemia: Secondary | ICD-10-CM

## 2014-01-02 DIAGNOSIS — R110 Nausea: Secondary | ICD-10-CM

## 2014-01-02 DIAGNOSIS — C50411 Malignant neoplasm of upper-outer quadrant of right female breast: Secondary | ICD-10-CM

## 2014-01-02 DIAGNOSIS — K921 Melena: Secondary | ICD-10-CM

## 2014-01-02 LAB — CBC WITH DIFFERENTIAL/PLATELET
BASO%: 0.5 % (ref 0.0–2.0)
Basophils Absolute: 0 10*3/uL (ref 0.0–0.1)
EOS%: 1.6 % (ref 0.0–7.0)
Eosinophils Absolute: 0.1 10*3/uL (ref 0.0–0.5)
HEMATOCRIT: 42.7 % (ref 34.8–46.6)
HEMOGLOBIN: 14.2 g/dL (ref 11.6–15.9)
LYMPH#: 1 10*3/uL (ref 0.9–3.3)
LYMPH%: 23.9 % (ref 14.0–49.7)
MCH: 31.3 pg (ref 25.1–34.0)
MCHC: 33.4 g/dL (ref 31.5–36.0)
MCV: 94 fL (ref 79.5–101.0)
MONO#: 0.2 10*3/uL (ref 0.1–0.9)
MONO%: 5.2 % (ref 0.0–14.0)
NEUT#: 2.9 10*3/uL (ref 1.5–6.5)
NEUT%: 68.8 % (ref 38.4–76.8)
Platelets: 170 10*3/uL (ref 145–400)
RBC: 4.55 10*6/uL (ref 3.70–5.45)
RDW: 13.8 % (ref 11.2–14.5)
WBC: 4.2 10*3/uL (ref 3.9–10.3)

## 2014-01-02 LAB — COMPREHENSIVE METABOLIC PANEL (CC13)
ALBUMIN: 3.1 g/dL — AB (ref 3.5–5.0)
ALT: 13 U/L (ref 0–55)
ANION GAP: 8 meq/L (ref 3–11)
AST: 19 U/L (ref 5–34)
Alkaline Phosphatase: 105 U/L (ref 40–150)
BUN: 9.7 mg/dL (ref 7.0–26.0)
CALCIUM: 9.8 mg/dL (ref 8.4–10.4)
CO2: 30 meq/L — AB (ref 22–29)
Chloride: 107 mEq/L (ref 98–109)
Creatinine: 0.8 mg/dL (ref 0.6–1.1)
Glucose: 148 mg/dl — ABNORMAL HIGH (ref 70–140)
POTASSIUM: 2.9 meq/L — AB (ref 3.5–5.1)
Sodium: 145 mEq/L (ref 136–145)
TOTAL PROTEIN: 6.5 g/dL (ref 6.4–8.3)
Total Bilirubin: 0.47 mg/dL (ref 0.20–1.20)

## 2014-01-02 MED ORDER — PROCHLORPERAZINE MALEATE 5 MG PO TABS: 5.0000 mg | ORAL_TABLET | Freq: Three times a day (TID) | ORAL | Status: DC | PRN

## 2014-01-02 MED ORDER — POTASSIUM CHLORIDE CRYS ER 20 MEQ PO TBCR: 40.0000 meq | EXTENDED_RELEASE_TABLET | Freq: Two times a day (BID) | ORAL | Status: DC

## 2014-01-02 NOTE — Progress Notes (Addendum)
OFFICE PROGRESS NOTE  CC  [name], MD [place] [id number] Dr. Madilyn Hook  Dr. Gery Pray  DIAGNOSIS: 80 year old female with new diagnosis of invasive ductal carcinoma stage III of the right breast diagnosed December 2013.  STAGE:  Right breast screening 12/20013  S/p Right lumpectomy with SNL  2.7 cm IDC, ER7%/PR-/Her2Neu+ (2.83),  Ki-67 19%%  8/11 Lymph nodes positive for metastatic disease   PRIOR THERAPY: #1 Patient had a routine screening mammogram performed in December 2013. This showed an abnormality in the upper outer quadrant of the right breast. There was also noted to be suspicious lymph nodes in the right axilla. She underwent biopsies of the both areas revealing invasive ductal carcinoma the right breast with metastatic carcinoma in the right axillary lymph node. HER-2/neu was amplified at 2.83. Tumor was partially staining at 7% for estrogen receptor and negative for progesterone receptor. MRI of the breast area revealed 2.7 x 2.4 x 2.2 cm lesion deep within the upper outer quadrant of the right breast. There were also noted to be suspicious level I right axillary lymph nodes. PET scan performed and revealed malignant uptake in the right breast lesion as well as hypermetabolic activity in the right axillary and right retropectoral lymph node areas.   #2Patient subsequently went to partial mastectomy with limited axillary dissection with plans for postoperative radiation therapy. On her final pathology she was found to have a 2.7 cm invasive moderately differentiated mammary carcinoma with mixed ductal and lobular phenotype. Lymphovascular space invasion was noted. 8 of 11 lymph nodes were positive for metastatic disease  #3 was seen by me on 12/06/2012 for discussion of treatment options. Patient was given option of receiving chemotherapy but she declined. Therefore she was referred to radiation oncology for adjuvant radiation.  She was begun on this and we'll completed in May 2014.  #4 began letrozole 2.5 mg daily starting 04/01/13. Total of 5 years of therapy is planned   CURRENT THERAPY: letrozole 2.5 mg daily  INTERVAL HISTORY: Amber [name] 80 y.o. female returns for followup visit.  She is doing moderately well today.  She is taking Letrozole 2.5mg  daily.  She reports constipation and her eyes feeling funny since starting the letrozole.  She takes Miralax, Dulcolax, and stool softeners if needed which help.  The past couple of days her stool has been thinner.  She is reporting dark bowel movements since May, 2014.  She also has intermittent nausea that began at that time as well.  It usually occurs in the morning after she eats and takes her morning medications.  She has pain in her right lower abdomen after she eats, and when she is constipated.  She also has right hip pain that extends through her right pelvis.  It sometimes travels down her leg.  She has this pain intermittently and it has been going on since starting the Letrozole as well.  We reviewed her health maintenance below.  Otherwise, a 10 point ROS is neg.   MEDICAL HISTORY: Past Medical History  Diagnosis Date  . Arthritis   . Asthma   . Diabetes mellitus without complication   . GERD (gastroesophageal reflux disease)   . Hyperlipidemia   . Hypertension   . Neuromuscular disorder   . Thyroid disease   . Hearing loss   . Visual disturbance   . Hypothyroidism   . Shortness of breath     occasionally with "excitement"  . Breast cancer 2013  right  . Hx of radiation therapy 01/27/13- 03/14/13    right breast axillary/supraclavicular region 45 Gy in 25 fx, upper outer breast cummulative dose of 61 Gy  . Pneumonia     HX OF PNA    ALLERGIES:  has No Known Allergies.  MEDICATIONS:  Current Outpatient Prescriptions  Medication Sig Dispense Refill  . ADVAIR DISKUS 250-50 MCG/DOSE AEPB Inhale 1 puff into the lungs 2 (two) times daily.        Marland Kitchen amLODipine (NORVASC) 5 MG tablet Take 5 mg by mouth Daily.       Marland Kitchen aspirin 81 MG tablet Take 81 mg by mouth daily.      . cholecalciferol (VITAMIN D) 1000 UNITS tablet Take 1 tablet (1,000 Units total) by mouth daily.  90 tablet  12  . furosemide (LASIX) 80 MG tablet Take 80 mg by mouth Daily.       Marland Kitchen letrozole (FEMARA) 2.5 MG tablet Take 1 tablet (2.5 mg total) by mouth daily.  90 tablet  12  . pravastatin (PRAVACHOL) 40 MG tablet Take 40 mg by mouth Daily.       . propranolol (INDERAL) 40 MG tablet Take 40 mg by mouth Daily.       Marland Kitchen SYNTHROID 150 MCG tablet Take 150 mcg by mouth Daily.       . traMADol (ULTRAM) 50 MG tablet Every 6 hours as needed.      Marland Kitchen albuterol (PROAIR HFA) 108 (90 BASE) MCG/ACT inhaler Inhale 1-2 puffs into the lungs every 4 (four) hours as needed for wheezing or shortness of breath. Use 2 puffs 3 times daily x 4 days then use as needed.  8.5 g  0  . diazepam (VALIUM) 5 MG tablet Three times daily as needed.      Marland Kitchen HYDROcodone-acetaminophen (NORCO/VICODIN) 5-325 MG per tablet Take 1 tablet by mouth every 4 (four) hours as needed.  40 tablet  0  . potassium chloride SA (K-DUR,KLOR-CON) 20 MEQ tablet Take 2 tablets (40 mEq total) by mouth 2 (two) times daily.  20 tablet  0  . prochlorperazine (COMPAZINE) 5 MG tablet Take 1 tablet (5 mg total) by mouth every 8 (eight) hours as needed for nausea or vomiting.  30 tablet  0   No current facility-administered medications for this visit.    SURGICAL HISTORY:  Past Surgical History  Procedure Laterality Date  . Back surgery  1980 - approximate  . Carpal tunnel release  unsure of date    both hands  . Rotator cuff repair  unsure of date    not sure which shoulder  . Elbow surgery  unsure of date  . Cervical spine surgery  unsure of date  . Abdominal hysterectomy    . Breast lumpectomy with needle localization and axillary lymph node dissection  11/22/2012    Procedure: BREAST LUMPECTOMY WITH NEDLE LOCALIZATION AND  AXILLARY LYMPH NODE DISSECTION;  Surgeon: Madilyn Hook, DO;  Location: [facility];  Service: General;  Laterality: Right;    REVIEW OF SYSTEMS:  A 10 point review of systems was conducted and is otherwise negative except for what is noted above.      HEALTH MAINTENANCE: Mammogram: 11/2012 Colonoscopy: less than 10 years, unsure of date Bone Density Scan: 04/2013 osteopenia Pap Smear: s/p TAH Eye Exam: due Vitamin D Level: unknown Lipid Panel: checked by [name]   PHYSICAL EXAMINATION: Blood pressure 147/80, pulse 79, temperature 98.3 F (36.8 C), temperature source Oral, resp. rate 18, height  5\' 2"  (1.575 m), weight 199 lb 6.4 oz (90.447 kg). Body mass index is 36.46 kg/(m^2). GENERAL: Patient is a chronically ill appearing female in no acute distress HEENT:  Sclerae anicteric.  Oropharynx clear and moist. No ulcerations or evidence of oropharyngeal candidiasis. Neck is supple.  NODES:  No cervical, supraclavicular, or axillary lymphadenopathy palpated.  BREAST EXAM: right breast no masses, or discharge, lumpectomy site without nodularity or sign of recurrence left breast with no masses or discharge, skin changes, benign breast exam.   LUNGS:  Clear to auscultation bilaterally.  No wheezes or rhonchi. HEART:  Regular rate and rhythm. No murmur appreciated. ABDOMEN:  Soft, nontender.  Positive, normoactive bowel sounds. No organomegaly palpated. MSK:  No focal spinal tenderness to palpation. Full range of motion bilaterally in the upper extremities. EXTREMITIES:  No peripheral edema.   SKIN:  Clear with no obvious rashes or skin changes. No nail dyscrasia. NEURO:  Nonfocal. Well oriented.  Appropriate affect. ECOG PERFORMANCE STATUS: 2 - Symptomatic, <50% confined to bed      LABORATORY DATA: Lab Results  Component Value Date   WBC 4.2 01/02/2014   HGB 14.2 01/02/2014   HCT 42.7 01/02/2014   MCV 94.0 01/02/2014   PLT 170 01/02/2014      Chemistry      Component Value Date/Time   NA  145 01/02/2014 0935   NA 141 11/05/2013 0523   K 2.9* 01/02/2014 0935   K 4.4 11/05/2013 0523   CL 104 11/05/2013 0523   CL 103 04/01/2013 1039   CO2 30* 01/02/2014 0935   CO2 27 11/05/2013 0523   BUN 9.7 01/02/2014 0935   BUN 14 11/05/2013 0523   CREATININE 0.8 01/02/2014 0935   CREATININE 0.86 11/05/2013 0523      Component Value Date/Time   CALCIUM 9.8 01/02/2014 0935   CALCIUM 9.5 11/05/2013 0523   ALKPHOS 105 01/02/2014 0935   AST 19 01/02/2014 0935   ALT 13 01/02/2014 0935   BILITOT 0.47 01/02/2014 0935       RADIOGRAPHIC STUDIES:  No results found.  ASSESSMENT: 80 year old female with  #1 2.7 cm invasive ductal carcinoma that is ER +70% PR negative HER-2/neu positive with a Ki-67 of 19% status post right lumpectomy with a positive lymph node for metastatic disease. Patient declined adjuvant HER-2/chemotherapy. She is receiving radiation therapy tolerating it well. No evidence of recurrent disease  #2 Declined chemo/herceptin.  #3 Constipation recommended using a stool softener twice a day such as Senokot.   PLAN:   #1  Patient is doing moderately well today.  She has no sign of recurrence and she will continue daily Letrozole.  We updated her health maintenance above.    #2  The change in her bowel movements is concerning.  I have ordered a CT of the abdomen and pelvis to ensure there is no malignancy.   Should it be negative, she should f/u with her PCP about it.  I have added on a FOBT today, and she will stop by the lab prior to leaving today.  She may need evaluation by GI.    #3 I prescribed Compazine, 5mg  for her intermittent nausea.  She was given detailed information about this medication in her AVS.    #4 I encouraged her to f/u with an ophthalmologist for an eye exam due to her vision changes.  #5 She will return in 6 months for labs and an office visit, or sooner if needed.    #6 Her  potassium is decreased today.  I prescribed Kdur 68meq to take bid, she will return on Friday to have  it rechecked.  I gave her detailed instructions on her potassium level, hypokalemia, and adverse effects in her AVS.    #7  Due to the hip and pelvic pain, we will get a bone scan to ensure she doesn't have any breast cancer recurrence.    All questions were answered. The patient knows to call the clinic with any problems, questions or concerns. We can certainly see the patient much sooner if necessary.  I spent 40 minutes counseling the patient face to face. The total time spent in the appointment was 50 minutes.   Minette Headland, [place] [phone] 01/04/2014, 9:35 AM

## 2014-01-02 NOTE — Patient Instructions (Addendum)
For your vision changes, you should have an eye exam by an ophthalmologist.  Continue your Letrozole daily.  No sign of recurrence.  We need a stool sample to determine if there is any blood in your stool.  The lab will give you stool cards.  Please let your PCP know about the changes in your bowel movements and the color changes.    Fecal Occult Blood Test This is a test done on a stool specimen to screen for gastrointestinal bleeding, which may be an indicator of colon cancer Is is usually done as part of a routine examination, annually, after age 10 or as directed by your caregiver. The fecal occult blood test (FOBT) checks for blood in your stool. Normally, there will not be enough blood lost through the gastrointestinal tract to turn an FOBT positive or for you to notice it visually in the form of bloody or dark, tarry stools. Any significant amount of blood being passed should be investigated.  A positive FOBT will tell your caregiver that you have bleeding occurring somewhere in your gastrointestinal tract. This blood loss could be due to ulcers, diverticulosis, bleeding polyps, inflammatory bowel disease, hemorrhoids, from swallowed blood due to bleeding gums or nosebleeds, or it could be due to benign or cancerous tumors. Anything that protrudes into the lumen (the empty space in the intestine), like a polyp or tumor, and is rubbed against by the fecal waste as it passes through has the potential to eventually bleed intermittently. Often this small amount of blood is the first, and sometimes the only, symptom of early colon cancer, making the FOBT a valuable screening tool. PREPARATION FOR TEST  You should not eat red meat within three days before testing. Other substances that could cause a false positive test result include fish, turnips, horseradish, and drugs such as colchicines and oxidizing drugs (for example, iodine and boric acid). Be sure to carefully follow your caregiver's  instructions. With FOBT, your caregiver or laboratory will give you one or more test "cards." You collect a separate sample from three different stools, usually on consecutive days. Each stool sample should be collected into a clean container and should not be contaminated with urine or water. The slide is labeled with your name and the date; then, with an applicator stick, you apply a thin smear of stool onto each filter paper square/window contained on the card. Allow the filter paper to dry. Once it is dry, it is stable. Usually you will collect all of the consecutive samples, and then return all of them to your caregiver or laboratory at the same time, sometimes by mailing them. There are also over the counter tests which are dropped in your toilet. NORMAL FINDINGS   No occult blood within the stool.  The FOBT test is normally negative. A positive indicates either blood in the stool or an interfering substance. Multiple samples are done to: 1) catch intermittent bleeding; and 2) help rule out false positives. Ranges for normal findings may vary among different laboratories and hospitals. You should always check with your doctor after having lab work or other tests done to discuss the meaning of your test results and whether your values are considered within normal limits. MEANING OF TEST  Your caregiver will go over the test results with you and discuss the importance and meaning of your results, as well as treatment options and the need for additional tests if necessary. OBTAINING THE TEST RESULTS  It is your responsibility to obtain your  test results. Ask the lab or department performing the test when and how you will get your results. Document Released: 11/14/2004 Document Revised: 01/12/2012 Document Reviewed: 09/29/2008 [facility] Patient Information 2014 [place], [place].  Prochlorperazine tablets What is this medicine? PROCHLORPERAZINE (proe klor PER a zeen) helps to control severe nausea  and vomiting. This medicine is also used to treat schizophrenia. It can also help patients who experience anxiety that is not due to psychological illness. This medicine may be used for other purposes; ask your health care provider or pharmacist if you have questions. COMMON BRAND NAME(S): Compazine What should I tell my health care provider before I take this medicine? They need to know if you have any of these conditions: -blood disorders or disease -dementia -liver disease or jaundice -Parkinson's disease -uncontrollable movement disorder -an unusual or allergic reaction to prochlorperazine, other medicines, foods, dyes, or preservatives -pregnant or trying to get pregnant -breast-feeding How should I use this medicine? Take this medicine by mouth with a glass of water. Follow the directions on the prescription label. Take your doses at regular intervals. Do not take your medicine more often than directed. Do not stop taking this medicine suddenly. This can cause nausea, vomiting, and dizziness. Ask your doctor or health care professional for advice. Talk to your pediatrician regarding the use of this medicine in children. Special care may be needed. While this drug may be prescribed for children as young as 2 years for selected conditions, precautions do apply. Overdosage: If you think you have taken too much of this medicine contact a poison control center or emergency room at once. NOTE: This medicine is only for you. Do not share this medicine with others. What if I miss a dose? If you miss a dose, take it as soon as you can. If it is almost time for your next dose, take only that dose. Do not take double or extra doses. What may interact with this medicine? Do not take this medicine with any of the following medications: -amoxapine -antidepressants like citalopram, escitalopram, fluoxetine, paroxetine, and sertraline -deferoxamine -dofetilide -maprotiline -tricyclic antidepressants  like amitriptyline, clomipramine, imipramine, nortiptyline and others This medicine may also interact with the following medications: -lithium -medicines for pain -phenytoin -propranolol -warfarin This list may not describe all possible interactions. Give your health care provider a list of all the medicines, herbs, non-prescription drugs, or dietary supplements you use. Also tell them if you smoke, drink alcohol, or use illegal drugs. Some items may interact with your medicine. What should I watch for while using this medicine? Visit your doctor or health care professional for regular checks on your progress. You may get drowsy or dizzy. Do not drive, use machinery, or do anything that needs mental alertness until you know how this medicine affects you. Do not stand or sit up quickly, especially if you are an older patient. This reduces the risk of dizzy or fainting spells. Alcohol may interfere with the effect of this medicine. Avoid alcoholic drinks. This medicine can reduce the response of your body to heat or cold. Dress warm in cold weather and stay hydrated in hot weather. If possible, avoid extreme temperatures like saunas, hot tubs, very hot or cold showers, or activities that can cause dehydration such as vigorous exercise. This medicine can make you more sensitive to the sun. Keep out of the sun. If you cannot avoid being in the sun, wear protective clothing and use sunscreen. Do not use sun lamps or tanning beds/booths. Your mouth  may get dry. Chewing sugarless gum or sucking hard candy, and drinking plenty of water may help. Contact your doctor if the problem does not go away or is severe. What side effects may I notice from receiving this medicine? Side effects that you should report to your doctor or health care professional as soon as possible: -blurred vision -breast enlargement in men or women -breast milk in women who are not breast-feeding -chest pain, fast or irregular  heartbeat -confusion, restlessness -dark yellow or brown urine -difficulty breathing or swallowing -dizziness or fainting spells -drooling, shaking, movement difficulty (shuffling walk) or rigidity -fever, chills, sore throat -involuntary or uncontrollable movements of the eyes, mouth, head, arms, and legs -seizures -stomach area pain -unusually weak or tired -unusual bleeding or bruising -yellowing of skin or eyes Side effects that usually do not require medical attention (report to your doctor or health care professional if they continue or are bothersome): -difficulty passing urine -difficulty sleeping -headache -sexual dysfunction -skin rash, or itching This list may not describe all possible side effects. Call your doctor for medical advice about side effects. You may report side effects to FDA at 1-800-FDA-1088. Where should I keep my medicine? Keep out of the reach of children. Store at room temperature between 15 and 30 degrees C (59 and 86 degrees F). Protect from light. Throw away any unused medicine after the expiration date. NOTE: This sheet is a summary. It may not cover all possible information. If you have questions about this medicine, talk to your doctor, pharmacist, or health care provider.  2014, Elsevier/Gold Standard. (2012-03-09 16:59:39)  Hypokalemia Hypokalemia means that the amount of potassium in the blood is lower than normal.Potassium is a chemical, called an electrolyte, that helps regulate the amount of fluid in the body. It also stimulates muscle contraction and helps nerves function properly.Most of the body's potassium is inside of cells, and only a very small amount is in the blood. Because the amount in the blood is so small, minor changes can be life-threatening. CAUSES  Antibiotics.  Diarrhea or vomiting.  Using laxatives too much, which can cause diarrhea.  Chronic kidney disease.  Water pills (diuretics).  Eating disorders  (bulimia).  Low magnesium level.  Sweating a lot. SIGNS AND SYMPTOMS  Weakness.  Constipation.  Fatigue.  Muscle cramps.  Mental confusion.  Skipped heartbeats or irregular heartbeat (palpitations).  Tingling or numbness. DIAGNOSIS  Your health care provider can diagnose hypokalemia with blood tests. In addition to checking your potassium level, your health care provider may also check other lab tests. TREATMENT Hypokalemia can be treated with potassium supplements taken by mouth or adjustments in your current medicines. If your potassium level is very low, you may need to get potassium through a vein (IV) and be monitored in the hospital. A diet high in potassium is also helpful. Foods high in potassium are:  Nuts, such as peanuts and pistachios.  Seeds, such as sunflower seeds and pumpkin seeds.  Peas, lentils, and lima beans.  Whole grain and bran cereals and breads.  Fresh fruit and vegetables, such as apricots, avocado, bananas, cantaloupe, kiwi, oranges, tomatoes, asparagus, and potatoes.  Orange and tomato juices.  Red meats.  Fruit yogurt. HOME CARE INSTRUCTIONS  Take all medicines as prescribed by your health care provider.  Maintain a healthy diet by including nutritious food, such as fruits, vegetables, nuts, whole grains, and lean meats.  If you are taking a laxative, be sure to follow the directions on the label. SEEK  MEDICAL CARE IF:  Your weakness gets worse.  You feel your heart pounding or racing.  You are vomiting or having diarrhea.  You are diabetic and having trouble keeping your blood glucose in the normal range. SEEK IMMEDIATE MEDICAL CARE IF:  You have chest pain, shortness of breath, or dizziness.  You are vomiting or having diarrhea for more than 2 days.  You faint. MAKE SURE YOU:   Understand these instructions.  Will watch your condition.  Will get help right away if you are not doing well or get worse. Document  Released: 10/20/2005 Document Revised: 08/10/2013 Document Reviewed: 04/22/2013 [facility] Patient Information 2014 [place].

## 2014-01-02 NOTE — Telephone Encounter (Signed)
appts made and printed. Pt is aware that cs will call w/ appts for NM Bone Scan and CT abd/pelvis...td

## 2014-01-06 ENCOUNTER — Other Ambulatory Visit (HOSPITAL_BASED_OUTPATIENT_CLINIC_OR_DEPARTMENT_OTHER): Payer: Medicare PPO

## 2014-01-06 ENCOUNTER — Other Ambulatory Visit: Payer: Medicare PPO

## 2014-01-06 ENCOUNTER — Other Ambulatory Visit: Payer: Self-pay | Admitting: Adult Health

## 2014-01-06 ENCOUNTER — Encounter (HOSPITAL_COMMUNITY): Payer: Self-pay

## 2014-01-06 ENCOUNTER — Ambulatory Visit (HOSPITAL_COMMUNITY)
Admission: RE | Admit: 2014-01-06 | Discharge: 2014-01-06 | Disposition: A | Discharge status: Home / Self Care | Payer: Medicare PPO | Source: Ambulatory Visit | Attending: Adult Health | Admitting: Adult Health

## 2014-01-06 DIAGNOSIS — C50419 Malignant neoplasm of upper-outer quadrant of unspecified female breast: Secondary | ICD-10-CM

## 2014-01-06 DIAGNOSIS — E876 Hypokalemia: Secondary | ICD-10-CM

## 2014-01-06 DIAGNOSIS — C50919 Malignant neoplasm of unspecified site of unspecified female breast: Secondary | ICD-10-CM | POA: Insufficient documentation

## 2014-01-06 DIAGNOSIS — Z9221 Personal history of antineoplastic chemotherapy: Secondary | ICD-10-CM | POA: Insufficient documentation

## 2014-01-06 DIAGNOSIS — K921 Melena: Secondary | ICD-10-CM

## 2014-01-06 LAB — COMPREHENSIVE METABOLIC PANEL (CC13)
ALK PHOS: 126 U/L (ref 40–150)
ALT: 14 U/L (ref 0–55)
AST: 17 U/L (ref 5–34)
Albumin: 3.7 g/dL (ref 3.5–5.0)
Anion Gap: 10 mEq/L (ref 3–11)
BILIRUBIN TOTAL: 0.4 mg/dL (ref 0.20–1.20)
BUN: 16 mg/dL (ref 7.0–26.0)
CO2: 29 mEq/L (ref 22–29)
CREATININE: 1.2 mg/dL — AB (ref 0.6–1.1)
Calcium: 10.3 mg/dL (ref 8.4–10.4)
Chloride: 108 mEq/L (ref 98–109)
Glucose: 99 mg/dl (ref 70–140)
Potassium: 4.7 mEq/L (ref 3.5–5.1)
Sodium: 147 mEq/L — ABNORMAL HIGH (ref 136–145)
Total Protein: 7.5 g/dL (ref 6.4–8.3)

## 2014-01-06 LAB — FECAL OCCULT BLOOD, GUAIAC: Occult Blood: NEGATIVE

## 2014-01-06 MED ORDER — IOHEXOL 300 MG/ML  SOLN
100.0000 mL | Freq: Once | INTRAMUSCULAR | Status: AC | PRN
  Administered 2014-01-06: 100 mL via INTRAVENOUS

## 2014-01-09 ENCOUNTER — Telehealth: Payer: Self-pay | Admitting: *Deleted

## 2014-01-09 NOTE — Telephone Encounter (Signed)
Peer NP request. Called pt with results of tests. Pt was not home but left detailed message with daughter concerning test results(K+, CT of abdomen and pelvis). Daughter said she would let her Mom know. Also Pt will be seeing [name] on March 17th. I will fax lab results to his office. Message to be forwarded to Charlestine Massed, NP.

## 2014-01-10 ENCOUNTER — Encounter (HOSPITAL_COMMUNITY)
Admission: RE | Admit: 2014-01-10 | Discharge: 2014-01-10 | Disposition: A | Discharge status: Home / Self Care | Payer: Medicare PPO | Source: Ambulatory Visit | Attending: Adult Health | Admitting: Adult Health

## 2014-01-10 DIAGNOSIS — C50419 Malignant neoplasm of upper-outer quadrant of unspecified female breast: Secondary | ICD-10-CM | POA: Insufficient documentation

## 2014-01-10 MED ORDER — TECHNETIUM TC 99M MEDRONATE IV KIT
25.1000 | PACK | Freq: Once | INTRAVENOUS | Status: AC | PRN
  Administered 2014-01-10: 25.1 via INTRAVENOUS

## 2014-01-18 ENCOUNTER — Telehealth: Payer: Self-pay | Admitting: Oncology

## 2014-01-18 NOTE — Telephone Encounter (Signed)
Faxed pt ct report to [facility]

## 2014-04-03 ENCOUNTER — Other Ambulatory Visit: Payer: Self-pay | Admitting: *Deleted

## 2014-04-03 DIAGNOSIS — C50419 Malignant neoplasm of upper-outer quadrant of unspecified female breast: Secondary | ICD-10-CM

## 2014-04-04 ENCOUNTER — Other Ambulatory Visit (HOSPITAL_BASED_OUTPATIENT_CLINIC_OR_DEPARTMENT_OTHER): Payer: Medicare PPO

## 2014-04-04 ENCOUNTER — Ambulatory Visit (HOSPITAL_BASED_OUTPATIENT_CLINIC_OR_DEPARTMENT_OTHER): Payer: Medicare PPO | Admitting: Adult Health

## 2014-04-04 ENCOUNTER — Encounter: Payer: Self-pay | Admitting: Adult Health

## 2014-04-04 VITALS — BP 123/71 | HR 69 | Temp 98.6°F | Resp 18 | Ht 62.0 in | Wt 193.1 lb

## 2014-04-04 DIAGNOSIS — C50419 Malignant neoplasm of upper-outer quadrant of unspecified female breast: Secondary | ICD-10-CM

## 2014-04-04 DIAGNOSIS — C773 Secondary and unspecified malignant neoplasm of axilla and upper limb lymph nodes: Secondary | ICD-10-CM

## 2014-04-04 LAB — COMPREHENSIVE METABOLIC PANEL (CC13)
ALT: 10 U/L (ref 0–55)
ANION GAP: 16 meq/L — AB (ref 3–11)
AST: 15 U/L (ref 5–34)
Albumin: 3.4 g/dL — ABNORMAL LOW (ref 3.5–5.0)
Alkaline Phosphatase: 102 U/L (ref 40–150)
BUN: 14.7 mg/dL (ref 7.0–26.0)
CO2: 25 mEq/L (ref 22–29)
CREATININE: 1 mg/dL (ref 0.6–1.1)
Calcium: 9.9 mg/dL (ref 8.4–10.4)
Chloride: 103 mEq/L (ref 98–109)
Glucose: 120 mg/dl (ref 70–140)
Potassium: 3.2 mEq/L — ABNORMAL LOW (ref 3.5–5.1)
Sodium: 144 mEq/L (ref 136–145)
Total Bilirubin: 0.59 mg/dL (ref 0.20–1.20)
Total Protein: 6.8 g/dL (ref 6.4–8.3)

## 2014-04-04 LAB — CBC WITH DIFFERENTIAL/PLATELET
BASO%: 0.4 % (ref 0.0–2.0)
Basophils Absolute: 0 10*3/uL (ref 0.0–0.1)
EOS ABS: 0 10*3/uL (ref 0.0–0.5)
EOS%: 1.3 % (ref 0.0–7.0)
HEMATOCRIT: 43.7 % (ref 34.8–46.6)
HEMOGLOBIN: 14.6 g/dL (ref 11.6–15.9)
LYMPH#: 1.2 10*3/uL (ref 0.9–3.3)
LYMPH%: 33.4 % (ref 14.0–49.7)
MCH: 30.6 pg (ref 25.1–34.0)
MCHC: 33.4 g/dL (ref 31.5–36.0)
MCV: 91.7 fL (ref 79.5–101.0)
MONO#: 0.3 10*3/uL (ref 0.1–0.9)
MONO%: 8.6 % (ref 0.0–14.0)
NEUT#: 2 10*3/uL (ref 1.5–6.5)
NEUT%: 56.3 % (ref 38.4–76.8)
Platelets: 182 10*3/uL (ref 145–400)
RBC: 4.77 10*6/uL (ref 3.70–5.45)
RDW: 13.5 % (ref 11.2–14.5)
WBC: 3.6 10*3/uL — ABNORMAL LOW (ref 3.9–10.3)

## 2014-04-04 NOTE — Patient Instructions (Signed)
You are doing well.  Continue taking Letrozole daily.  I recommend healthy diet, and self breast exams.  Please call us if you have any questions or concerns.     Breast Self-Awareness Practicing breast self-awareness may pick up problems early, prevent significant medical complications, and possibly save your life. By practicing breast self-awareness, you can become familiar with how your breasts look and feel and if your breasts are changing. This allows you to notice changes early. It can also offer you some reassurance that your breast health is good. One way to learn what is normal for your breasts and whether your breasts are changing is to do a breast self-exam. If you find a lump or something that was not present in the past, it is best to contact your caregiver right away. Other findings that should be evaluated by your caregiver include nipple discharge, especially if it is bloody; skin changes or reddening; areas where the skin seems to be pulled in (retracted); or new lumps and bumps. Breast pain is seldom associated with cancer (malignancy), but should also be evaluated by a caregiver. HOW TO PERFORM A BREAST SELF-EXAM The best time to examine your breasts is 5 7 days after your menstrual period is over. During menstruation, the breasts are lumpier, and it may be more difficult to pick up changes. If you do not menstruate, have reached menopause, or had your uterus removed (hysterectomy), you should examine your breasts at regular intervals, such as monthly. If you are breastfeeding, examine your breasts after a feeding or after using a breast pump. Breast implants do not decrease the risk for lumps or tumors, so continue to perform breast self-exams as recommended. Talk to your caregiver about how to determine the difference between the implant and breast tissue. Also, talk about the amount of pressure you should use during the exam. Over time, you will become more familiar with the variations  of your breasts and more comfortable with the exam. A breast self-exam requires you to remove all your clothes above the waist. 1. Look at your breasts and nipples. Stand in front of a mirror in a room with good lighting. With your hands on your hips, push your hands firmly downward. Look for a difference in shape, contour, and size from one breast to the other (asymmetry). Asymmetry includes puckers, dips, or bumps. Also, look for skin changes, such as reddened or scaly areas on the breasts. Look for nipple changes, such as discharge, dimpling, repositioning, or redness. 2. Carefully feel your breasts. This is best done either in the shower or tub while using soapy water or when flat on your back. Place the arm (on the side of the breast you are examining) above your head. Use the pads (not the fingertips) of your three middle fingers on your opposite hand to feel your breasts. Start in the underarm area and use  inch (2 cm) overlapping circles to feel your breast. Use 3 different levels of pressure (light, medium, and firm pressure) at each circle before moving to the next circle. The light pressure is needed to feel the tissue closest to the skin. The medium pressure will help to feel breast tissue a little deeper, while the firm pressure is needed to feel the tissue close to the ribs. Continue the overlapping circles, moving downward over the breast until you feel your ribs below your breast. Then, move one finger-width towards the center of the body. Continue to use the  inch (2 cm) overlapping circles  to feel your breast as you move slowly up toward the collar bone (clavicle) near the base of the neck. Continue the up and down exam using all 3 pressures until you reach the middle of the chest. Do this with each breast, carefully feeling for lumps or changes. 3.  Keep a written record with breast changes or normal findings for each breast. By writing this information down, you do not need to depend only  on memory for size, tenderness, or location. Write down where you are in your menstrual cycle, if you are still menstruating. Breast tissue can have some lumps or thick tissue. However, see your caregiver if you find anything that concerns you.  SEEK MEDICAL CARE IF:  You see a change in shape, contour, or size of your breasts or nipples.   You see skin changes, such as reddened or scaly areas on the breasts or nipples.   You have an unusual discharge from your nipples.   You feel a new lump or unusually thick areas.  Document Released: 10/20/2005 Document Revised: 10/06/2012 Document Reviewed: 02/04/2012 [facility] Patient Information 2014 [place].

## 2014-04-04 NOTE — Progress Notes (Signed)
OFFICE PROGRESS NOTE  CC  Amber Bolt, MD [address] [place] [address] Dr. Madilyn Hook  Dr. Gery Pray  DIAGNOSIS: 80 year old female with new diagnosis of invasive ductal carcinoma stage III of the right breast diagnosed December 2013.  STAGE:  Right breast screening 12/20013  S/p Right lumpectomy with SNL  2.7 cm IDC, ER7%/PR-/Her2Neu+ (2.83),  Ki-67 19%%  8/11 Lymph nodes positive for metastatic disease   PRIOR THERAPY: #1 Patient had a routine screening mammogram performed in December 2013. This showed an abnormality in the upper outer quadrant of the right breast. There was also noted to be suspicious lymph nodes in the right axilla. She underwent biopsies of the both areas revealing invasive ductal carcinoma the right breast with metastatic carcinoma in the right axillary lymph node. HER-2/neu was amplified at 2.83. Tumor was partially staining at 7% for estrogen receptor and negative for progesterone receptor. MRI of the breast area revealed 2.7 x 2.4 x 2.2 cm lesion deep within the upper outer quadrant of the right breast. There were also noted to be suspicious level I right axillary lymph nodes. PET scan performed and revealed malignant uptake in the right breast lesion as well as hypermetabolic activity in the right axillary and right retropectoral lymph node areas.   #2Patient subsequently went to partial mastectomy with limited axillary dissection with plans for postoperative radiation therapy. On her final pathology she was found to have a 2.7 cm invasive moderately differentiated mammary carcinoma with mixed ductal and lobular phenotype. Lymphovascular space invasion was noted. 8 of 11 lymph nodes were positive for metastatic disease  #3 was seen by me on 12/06/2012 for discussion of treatment options. Patient was given option of receiving chemotherapy but she declined. Therefore she was referred to radiation oncology for adjuvant radiation. She  was begun on this and we'll completed in May 2014.  #4 began letrozole 2.5 mg daily starting 04/01/13. Total of 5 years of therapy is planned   CURRENT THERAPY: letrozole 2.5 mg daily  INTERVAL HISTORY: Amber [name] 80 y.o. female returns for followup visit.  She is doing moderately well today.  She is taking Letrozole 2.5mg  daily.  She does have some intermittent nausea.  She does f/u with her PCP and does take a stool softener and laxative PRN.  She denies fevers, chills, new pain, hot flashes, joint aches, or any further concerns.  We updated her health maintenance below.    MEDICAL HISTORY: Past Medical History  Diagnosis Date  . Arthritis   . Asthma   . Diabetes mellitus without complication   . GERD (gastroesophageal reflux disease)   . Hyperlipidemia   . Hypertension   . Neuromuscular disorder   . Thyroid disease   . Hearing loss   . Visual disturbance   . Hypothyroidism   . Shortness of breath     occasionally with "excitement"  . Hx of radiation therapy 01/27/13- 03/14/13    right breast axillary/supraclavicular region 45 Gy in 25 fx, upper outer breast cummulative dose of 61 Gy  . Pneumonia     HX OF PNA  . Breast cancer 2013    right    ALLERGIES:  has No Known Allergies.  MEDICATIONS:  Current Outpatient Prescriptions  Medication Sig Dispense Refill  . ADVAIR DISKUS 250-50 MCG/DOSE AEPB Inhale 1 puff into the lungs 2 (two) times daily.       Marland Kitchen amLODipine (NORVASC) 5 MG tablet Take 5 mg by mouth Daily.       Marland Kitchen  aspirin 81 MG tablet Take 81 mg by mouth daily.      . cholecalciferol (VITAMIN D) 1000 UNITS tablet Take 1 tablet (1,000 Units total) by mouth daily.  90 tablet  12  . furosemide (LASIX) 80 MG tablet Take 80 mg by mouth Daily.       Marland Kitchen HYDROcodone-acetaminophen (NORCO/VICODIN) 5-325 MG per tablet Take 1 tablet by mouth every 4 (four) hours as needed.  40 tablet  0  . letrozole (FEMARA) 2.5 MG tablet Take 1 tablet (2.5 mg total) by mouth daily.  90  tablet  12  . pravastatin (PRAVACHOL) 40 MG tablet Take 40 mg by mouth Daily.       . propranolol (INDERAL) 40 MG tablet Take 40 mg by mouth Daily.       Marland Kitchen SYNTHROID 150 MCG tablet Take 150 mcg by mouth Daily.       Marland Kitchen albuterol (PROAIR HFA) 108 (90 BASE) MCG/ACT inhaler Inhale 1-2 puffs into the lungs every 4 (four) hours as needed for wheezing or shortness of breath. Use 2 puffs 3 times daily x 4 days then use as needed.  8.5 g  0  . diazepam (VALIUM) 5 MG tablet Three times daily as needed.      . potassium chloride SA (K-DUR,KLOR-CON) 20 MEQ tablet Take 2 tablets (40 mEq total) by mouth 2 (two) times daily.  20 tablet  0  . prochlorperazine (COMPAZINE) 5 MG tablet Take 1 tablet (5 mg total) by mouth every 8 (eight) hours as needed for nausea or vomiting.  30 tablet  0  . traMADol (ULTRAM) 50 MG tablet Every 6 hours as needed.       No current facility-administered medications for this visit.    SURGICAL HISTORY:  Past Surgical History  Procedure Laterality Date  . Back surgery  1980 - approximate  . Carpal tunnel release  unsure of date    both hands  . Rotator cuff repair  unsure of date    not sure which shoulder  . Elbow surgery  unsure of date  . Cervical spine surgery  unsure of date  . Abdominal hysterectomy    . Breast lumpectomy with needle localization and axillary lymph node dissection  11/22/2012    Procedure: BREAST LUMPECTOMY WITH NEDLE LOCALIZATION AND AXILLARY LYMPH NODE DISSECTION;  Surgeon: Madilyn Hook, DO;  Location: [place];  Service: General;  Laterality: Right;    REVIEW OF SYSTEMS:  A 10 point review of systems was conducted and is otherwise negative except for what is noted above.      HEALTH MAINTENANCE: Mammogram: 11/2013 Colonoscopy: less than 10 years, unsure of date Bone Density Scan: 04/2013 osteopenia Pap Smear: s/p TAH Eye Exam: due Vitamin D Level: unknown Lipid Panel: checked by [name]   PHYSICAL EXAMINATION: Blood pressure 123/71, pulse  69, temperature 98.6 F (37 C), temperature source Oral, resp. rate 18, height $RemoveBe'5\' 2"'zcohyTBfj$  (1.575 m), weight 193 lb 1.6 oz (87.59 kg). Body mass index is 35.31 kg/(m^2).EXAM LIMITED PATIENT IN [facility] GENERAL: Patient is a chronically ill appearing female in no acute distress HEENT:  Sclerae anicteric.  Oropharynx clear and moist. No ulcerations or evidence of oropharyngeal candidiasis. Neck is supple.  NODES:  No cervical, supraclavicular, or axillary lymphadenopathy palpated.  BREAST EXAM: right breast no masses, or discharge, lumpectomy site without nodularity or sign of recurrence left breast with no masses or discharge, skin changes, benign breast exam.   LUNGS:  Clear to auscultation bilaterally.  No  wheezes or rhonchi. HEART:  Regular rate and rhythm. No murmur appreciated. ABDOMEN:  Soft, nontender.  Positive, normoactive bowel sounds. No organomegaly palpated. MSK:  No focal spinal tenderness to palpation. Full range of motion bilaterally in the upper extremities. EXTREMITIES:  No peripheral edema.   SKIN:  Clear with no obvious rashes or skin changes. No nail dyscrasia. NEURO:  Nonfocal. Well oriented.  Appropriate affect. ECOG PERFORMANCE STATUS: 2 - Symptomatic, <50% confined to bed      LABORATORY DATA: Lab Results  Component Value Date   WBC 3.6* 04/04/2014   HGB 14.6 04/04/2014   HCT 43.7 04/04/2014   MCV 91.7 04/04/2014   PLT 182 04/04/2014      Chemistry      Component Value Date/Time   NA 144 04/04/2014 1020   NA 141 11/05/2013 0523   K 3.2* 04/04/2014 1020   K 4.4 11/05/2013 0523   CL 104 11/05/2013 0523   CL 103 04/01/2013 1039   CO2 25 04/04/2014 1020   CO2 27 11/05/2013 0523   BUN 14.7 04/04/2014 1020   BUN 14 11/05/2013 0523   CREATININE 1.0 04/04/2014 1020   CREATININE 0.86 11/05/2013 0523      Component Value Date/Time   CALCIUM 9.9 04/04/2014 1020   CALCIUM 9.5 11/05/2013 0523   ALKPHOS 102 04/04/2014 1020   AST 15 04/04/2014 1020   ALT 10 04/04/2014 1020   BILITOT 0.59 04/04/2014 1020        RADIOGRAPHIC STUDIES:  No results found.  ASSESSMENT: 80 year old female with  #1 2.7 cm invasive ductal carcinoma that is ER +70% PR negative HER-2/neu positive with a Ki-67 of 19% status post right lumpectomy with a positive lymph node for metastatic disease. Patient declined adjuvant HER-2/chemotherapy. She is receiving radiation therapy tolerating it well. No evidence of recurrent disease  #2 Declined chemo/herceptin.  PLAN:   #1  Patient is doing moderately well today.  She has no sign of recurrence and she will continue daily Letrozole.  We updated her health maintenance above.  I recommended a healthy diet and self breast exams.  She will return in 6 months for labs and evlauation.    All questions were answered. The patient knows to call the clinic with any problems, questions or concerns. We can certainly see the patient much sooner if necessary.  I spent 15 minutes counseling the patient face to face. The total time spent in the appointment was 30 minutes.   Minette Headland, [place] [phone] 04/05/2014, 2:48 PM

## 2014-04-05 ENCOUNTER — Telehealth: Payer: Self-pay | Admitting: Adult Health

## 2014-04-05 NOTE — Telephone Encounter (Signed)
cld & spoke with pt daughter Hassan Rowan and Harvin Hazel of next appt time & date-daughter understood

## 2014-05-08 ENCOUNTER — Other Ambulatory Visit: Payer: Self-pay | Admitting: Adult Health

## 2014-07-04 ENCOUNTER — Other Ambulatory Visit: Payer: Medicare PPO

## 2014-07-04 ENCOUNTER — Other Ambulatory Visit: Payer: Self-pay

## 2014-07-04 ENCOUNTER — Encounter: Payer: Self-pay | Admitting: Hematology and Oncology

## 2014-07-04 ENCOUNTER — Telehealth: Payer: Self-pay | Admitting: Hematology and Oncology

## 2014-07-04 ENCOUNTER — Ambulatory Visit (HOSPITAL_BASED_OUTPATIENT_CLINIC_OR_DEPARTMENT_OTHER): Payer: Medicare PPO | Admitting: Hematology and Oncology

## 2014-07-04 ENCOUNTER — Ambulatory Visit: Payer: Medicare PPO | Admitting: Adult Health

## 2014-07-04 ENCOUNTER — Other Ambulatory Visit (HOSPITAL_BASED_OUTPATIENT_CLINIC_OR_DEPARTMENT_OTHER): Payer: Medicare PPO

## 2014-07-04 VITALS — BP 120/48 | HR 67 | Temp 98.5°F | Resp 18 | Ht 62.0 in | Wt 194.6 lb

## 2014-07-04 DIAGNOSIS — E876 Hypokalemia: Secondary | ICD-10-CM

## 2014-07-04 DIAGNOSIS — C50411 Malignant neoplasm of upper-outer quadrant of right female breast: Secondary | ICD-10-CM

## 2014-07-04 DIAGNOSIS — C773 Secondary and unspecified malignant neoplasm of axilla and upper limb lymph nodes: Secondary | ICD-10-CM

## 2014-07-04 DIAGNOSIS — C50419 Malignant neoplasm of upper-outer quadrant of unspecified female breast: Secondary | ICD-10-CM

## 2014-07-04 DIAGNOSIS — N6489 Other specified disorders of breast: Secondary | ICD-10-CM

## 2014-07-04 DIAGNOSIS — R5383 Other fatigue: Secondary | ICD-10-CM

## 2014-07-04 DIAGNOSIS — R5381 Other malaise: Secondary | ICD-10-CM

## 2014-07-04 LAB — COMPREHENSIVE METABOLIC PANEL (CC13)
ALT: 11 U/L (ref 0–55)
AST: 15 U/L (ref 5–34)
Albumin: 3.3 g/dL — ABNORMAL LOW (ref 3.5–5.0)
Alkaline Phosphatase: 107 U/L (ref 40–150)
Anion Gap: 11 mEq/L (ref 3–11)
BUN: 10.8 mg/dL (ref 7.0–26.0)
CALCIUM: 9.7 mg/dL (ref 8.4–10.4)
CHLORIDE: 106 meq/L (ref 98–109)
CO2: 30 mEq/L — ABNORMAL HIGH (ref 22–29)
Creatinine: 1 mg/dL (ref 0.6–1.1)
GLUCOSE: 140 mg/dL (ref 70–140)
POTASSIUM: 3 meq/L — AB (ref 3.5–5.1)
SODIUM: 147 meq/L — AB (ref 136–145)
TOTAL PROTEIN: 6.6 g/dL (ref 6.4–8.3)
Total Bilirubin: 0.48 mg/dL (ref 0.20–1.20)

## 2014-07-04 LAB — CBC WITH DIFFERENTIAL/PLATELET
BASO%: 0.3 % (ref 0.0–2.0)
BASOS ABS: 0 10*3/uL (ref 0.0–0.1)
EOS%: 1.9 % (ref 0.0–7.0)
Eosinophils Absolute: 0.1 10*3/uL (ref 0.0–0.5)
HCT: 40.4 % (ref 34.8–46.6)
HEMOGLOBIN: 13.5 g/dL (ref 11.6–15.9)
LYMPH#: 1.2 10*3/uL (ref 0.9–3.3)
LYMPH%: 33.2 % (ref 14.0–49.7)
MCH: 30.8 pg (ref 25.1–34.0)
MCHC: 33.4 g/dL (ref 31.5–36.0)
MCV: 92 fL (ref 79.5–101.0)
MONO#: 0.2 10*3/uL (ref 0.1–0.9)
MONO%: 4.5 % (ref 0.0–14.0)
NEUT#: 2.3 10*3/uL (ref 1.5–6.5)
NEUT%: 60.1 % (ref 38.4–76.8)
Platelets: 140 10*3/uL — ABNORMAL LOW (ref 145–400)
RBC: 4.39 10*6/uL (ref 3.70–5.45)
RDW: 14.2 % (ref 11.2–14.5)
WBC: 3.7 10*3/uL — ABNORMAL LOW (ref 3.9–10.3)

## 2014-07-04 NOTE — Progress Notes (Signed)
Patient Care Team: [name], MD as PCP - General  DIAGNOSIS: Cancer of upper-outer quadrant of female breast   Primary site: Breast (Right)   Staging method: AJCC 7th Edition   Pathologic: Stage IIIA (T2, N2a, cM0) signed by Rulon Eisenmenger, MD on 07/04/2014  9:14 AM   Summary: Stage IIIA (T2, N2a, cM0)   SUMMARY OF ONCOLOGIC HISTORY:   Cancer of upper-outer quadrant of female breast   10/19/2012 Initial Diagnosis Cancer of upper-outer quadrant of female breast; IDC ER 7% PR 0% Ki-67 19% HER-2 positive ratio 2.83; lymph node biopsy positive for cancer   11/09/2012 Breast MRI 2.7 x 2.4 x 2.2 cm biopsy-proven invasive ductal carcinoma deep in the upper outer quadrant of the right breast.1.8 cm biopsy-proven metastatic level I right axillary lymph node.    11/23/2012 Surgery Right breast lumpectomy 2.7 cm moderately differentiated mammary cancer next ductal and lobular features associated LCIS, LVIS present margins negative   01/01/2013 - 02/22/2014 Radiation Therapy Radiation therapy to lumpectomy site   04/01/2013 -  Anti-estrogen oral therapy Letrozole 2.5 mg started 04/01/2013 plan is for 5 years of therapy    CHIEF COMPLIANT: Right breast nipple inversion and nodularity since radiation is complete tenderness in the right axilla fatigue related to antiestrogen therapy, occasional hot flashes  INTERVAL HISTORY: Amber [name] is 80 year old American lady with above-mentioned history of right breast cancer that was treated with lumpectomy radiation therapy. She is HER-2 positive but elected to not received any therapy she is currently on adjuvant antiestrogen therapy with letrozole since one year. Even though she is tolerating it fairly well she complains that the pill makes her feel weak, hot flashes and generalized weakness and fatigue. She is very concerned about her right breast with the way the nipple it appears to be slightly inverted and the dense nodularity of the right breast in addition to  tenderness in the right axilla. This is how it has been since radiation was complete.   REVIEW OF SYSTEMS:   Constitutional: Denies fevers, chills or abnormal weight loss Eyes: Denies blurriness of vision Ears, nose, mouth, throat, and face: Denies mucositis or sore throat Respiratory: Denies cough, dyspnea or wheezes Cardiovascular: Denies palpitation, chest discomfort or lower extremity swelling Gastrointestinal:  Denies nausea, heartburn or change in bowel habits Skin: Denies abnormal skin rashes Lymphatics: Denies new lymphadenopathy or easy bruising Neurological:Denies numbness, tingling or new weaknesses Behavioral/Psych: Mood is stable, no new changes  Breast: Complains of lumpiness and nodularity and tenderness in the right breast and axilla where she had prior radiation All other systems were reviewed with the patient and are negative.  I have reviewed the past medical history, past surgical history, social history and family history with the patient and they are unchanged from previous note.  ALLERGIES:  has No Known Allergies.  MEDICATIONS:  Current Outpatient Prescriptions  Medication Sig Dispense Refill  . ADVAIR DISKUS 250-50 MCG/DOSE AEPB Inhale 1 puff into the lungs 2 (two) times daily.       Marland Kitchen albuterol (PROAIR HFA) 108 (90 BASE) MCG/ACT inhaler Inhale 1-2 puffs into the lungs every 4 (four) hours as needed for wheezing or shortness of breath. Use 2 puffs 3 times daily x 4 days then use as needed.  8.5 g  0  . amLODipine (NORVASC) 5 MG tablet Take 5 mg by mouth Daily.       Marland Kitchen aspirin 81 MG tablet Take 81 mg by mouth daily.      . cholecalciferol (VITAMIN  D) 1000 UNITS tablet Take 1 tablet (1,000 Units total) by mouth daily.  90 tablet  12  . diazepam (VALIUM) 5 MG tablet Three times daily as needed.      . furosemide (LASIX) 80 MG tablet Take 80 mg by mouth Daily.       Marland Kitchen HYDROcodone-acetaminophen (NORCO/VICODIN) 5-325 MG per tablet Take 1 tablet by mouth every 4  (four) hours as needed.  40 tablet  0  . letrozole (FEMARA) 2.5 MG tablet Take 1 tablet (2.5 mg total) by mouth daily.  90 tablet  12  . potassium chloride SA (K-DUR,KLOR-CON) 20 MEQ tablet Take 2 tablets (40 mEq total) by mouth 2 (two) times daily.  20 tablet  0  . pravastatin (PRAVACHOL) 40 MG tablet Take 40 mg by mouth Daily.       . prochlorperazine (COMPAZINE) 5 MG tablet TAKE 1 TABLET EVERY 8 HOURS AS NEEDED FOR NAUSEA AND VOMITING.  30 tablet  3  . propranolol (INDERAL) 40 MG tablet Take 40 mg by mouth Daily.       Marland Kitchen SYNTHROID 150 MCG tablet Take 150 mcg by mouth Daily.       . traMADol (ULTRAM) 50 MG tablet Every 6 hours as needed.       No current facility-administered medications for this visit.    PHYSICAL EXAMINATION: ECOG PERFORMANCE STATUS: 2 - Symptomatic, <50% confined to bed  Filed Vitals:   07/04/14 1020  BP: 120/48  Pulse: 67  Temp: 98.5 F (36.9 C)  Resp: 18   Filed Weights   07/04/14 1020  Weight: 194 lb 9 oz (88.253 kg)    GENERAL:alert, no distress and comfortable SKIN: skin color, texture, turgor are normal, no rashes or significant lesions EYES: normal, Conjunctiva are pink and non-injected, sclera clear OROPHARYNX:no exudate, no erythema and lips, buccal mucosa, and tongue normal  NECK: supple, thyroid normal size, non-tender, without nodularity LYMPH:  no palpable lymphadenopathy in the cervical, axillary or inguinal LUNGS: clear to auscultation and percussion with normal breathing effort HEART: regular rate & rhythm and no murmurs and no lower extremity edema ABDOMEN:abdomen soft, non-tender and normal bowel sounds Musculoskeletal:no cyanosis of digits and no clubbing  NEURO: alert & oriented x 3 with fluent speech, no focal motor/sensory deficits BREAST right breast again a nodular abnormality encompassing the entire right breast towards the lateral and superior aspect. Tenderness in the right axillary fold where she had a surgical scar. No palpable  axillary supraclavicular or infraclavicular adenopathy no breast tenderness or nipple discharge. Left breast exam is normal   LABORATORY DATA:  I have reviewed the data as listed   Chemistry      Component Value Date/Time   NA 147* 07/04/2014 1001   NA 141 11/05/2013 0523   K 3.0* 07/04/2014 1001   K 4.4 11/05/2013 0523   CL 104 11/05/2013 0523   CL 103 04/01/2013 1039   CO2 30* 07/04/2014 1001   CO2 27 11/05/2013 0523   BUN 10.8 07/04/2014 1001   BUN 14 11/05/2013 0523   CREATININE 1.0 07/04/2014 1001   CREATININE 0.86 11/05/2013 0523      Component Value Date/Time   CALCIUM 9.7 07/04/2014 1001   CALCIUM 9.5 11/05/2013 0523   ALKPHOS 107 07/04/2014 1001   AST 15 07/04/2014 1001   ALT 11 07/04/2014 1001   BILITOT 0.48 07/04/2014 1001       Lab Results  Component Value Date   WBC 3.7* 07/04/2014   HGB 13.5 07/04/2014  HCT 40.4 07/04/2014   MCV 92.0 07/04/2014   PLT 140* 07/04/2014   NEUTROABS 2.3 07/04/2014     RADIOGRAPHIC STUDIES: I have personally reviewed the radiology reports and agreed with their findings. No results found.   ASSESSMENT & PLAN:  Cancer of upper-outer quadrant of female breast Right breast invasive ductal carcinoma stage IIIa T2, N2, M0 diagnosed in December 2013 treated with lumpectomy. She is currently on oral antiestrogen therapy with letrozole started may 2014. Patient complains of hot flashes fatigue, depression type symptoms and she attributes that to being omeprazole. She also complains of nipple inversion which was apparently noted previously but mammograms done in January 2015 within normal. Today's breast exam revealed condensed right breast which formed into a dense firm masslike in appearance with tenderness in the right axilla.  I would like to obtain a diagnostic mammogram of the right breast an ultrasound. I suspect this is post radiation changes but to be cautious we will obtain these imaging studies at [facility].  Return to clinic in one month to discuss the results of the  mammogram and ultrasound.  Fatigue related to antiestrogen therapy: Is unclear whether it is related to letrozole or it is related to her generalized aging and performance status. I elected to the patient if she wished to continue or stop the treatment. She wants to continue on it for the time being.   2. Hypokalemia related to diuretic use: I encouraged her to increase her potassium to twice a day. We will recheck her BMP on followup in one month  Orders Placed This Encounter  Procedures  . MM Digital Diagnostic Unilat R    Standing Status: Future     Number of Occurrences:      Standing Expiration Date: 07/04/2015    Order Specific Question:  Reason for Exam (SYMPTOM  OR DIAGNOSIS REQUIRED)    Answer:  Dense mass like appearance post radiation evaluate with mammogram and ultrasound    Order Specific Question:  Preferred imaging location?    Answer:  External     Comments:  [facility]  . US BREAST COMPLETE UNI RIGHT INC AXILLA    Standing Status: Future     Number of Occurrences:      Standing Expiration Date: 09/04/2015    Order Specific Question:  Reason for Exam (SYMPTOM  OR DIAGNOSIS REQUIRED)    Answer:  Breast nodularity after radiation therapy    Order Specific Question:  Preferred imaging location?    Answer:  External     Comments:  [facility]  . Basic metabolic panel (Bmet) - [facility]    Standing Status: Future     Number of Occurrences:      Standing Expiration Date: 07/04/2015   The patient has a good understanding of the overall plan. she agrees with it. She will call with any problems that may develop before her next visit here.  I spent 25 minutes counseling the patient face to face. The total time spent in the appointment was 30 minutes and more than 50% was on counseling and review of test results    Rulon Eisenmenger, MD 07/04/2014 10:58 AM

## 2014-07-04 NOTE — Assessment & Plan Note (Signed)
Right breast invasive ductal carcinoma stage IIIa T2, N2, M0 diagnosed in December 2013 treated with lumpectomy. She is currently on oral antiestrogen therapy with letrozole started may 2014. Patient complains of hot flashes fatigue, depression type symptoms and she attributes that to being omeprazole. She also complains of nipple inversion which was apparently noted previously but mammograms done in January 2015 within normal. Today's breast exam revealed condensed right breast which formed into a dense firm masslike in appearance with tenderness in the right axilla.  I would like to obtain a diagnostic mammogram of the right breast an ultrasound. I suspect this is post radiation changes but to be cautious we will obtain these imaging studies at [facility].  Return to clinic in one month to discuss the results of the mammogram and ultrasound.  Fatigue related to antiestrogen therapy: Is unclear whether it is related to letrozole or it is related to her generalized aging and performance status. I elected to the patient if she wished to continue or stop the treatment. She wants to continue on it for the time being.

## 2014-07-04 NOTE — Telephone Encounter (Signed)
per pof to sch pt appt-sch-sch mamma-US @ [facility] appt fro 9/2 @1 :00-gave pt appt time date location-gave vopy of sch

## 2014-07-05 ENCOUNTER — Other Ambulatory Visit: Payer: Self-pay | Admitting: Oncology

## 2014-07-05 DIAGNOSIS — C50419 Malignant neoplasm of upper-outer quadrant of unspecified female breast: Secondary | ICD-10-CM

## 2014-07-07 ENCOUNTER — Telehealth: Payer: Self-pay

## 2014-07-07 NOTE — Telephone Encounter (Signed)
Faxed order to [facility] for ultrasound.  Original to scan.

## 2014-07-11 ENCOUNTER — Other Ambulatory Visit: Payer: Self-pay | Admitting: Radiology

## 2014-07-14 ENCOUNTER — Ambulatory Visit (HOSPITAL_BASED_OUTPATIENT_CLINIC_OR_DEPARTMENT_OTHER): Payer: Medicare PPO | Admitting: Hematology and Oncology

## 2014-07-14 VITALS — BP 128/54 | HR 70 | Temp 98.1°F | Resp 18 | Ht 62.0 in | Wt 193.9 lb

## 2014-07-14 DIAGNOSIS — C50411 Malignant neoplasm of upper-outer quadrant of right female breast: Secondary | ICD-10-CM

## 2014-07-14 DIAGNOSIS — Z170 Estrogen receptor positive status [ER+]: Secondary | ICD-10-CM

## 2014-07-14 DIAGNOSIS — C50419 Malignant neoplasm of upper-outer quadrant of unspecified female breast: Secondary | ICD-10-CM

## 2014-07-14 DIAGNOSIS — C773 Secondary and unspecified malignant neoplasm of axilla and upper limb lymph nodes: Secondary | ICD-10-CM

## 2014-07-14 NOTE — Progress Notes (Signed)
Patient Care Team: Amber Kraft, MD as PCP - General  DIAGNOSIS: Cancer of upper-outer quadrant of female breast   Primary site: Breast (Right)   Staging method: AJCC 7th Edition   Pathologic: Stage IIIA (T2, N2a, cM0) signed by Rulon Eisenmenger, MD on 07/04/2014  9:14 AM   Summary: Stage IIIA (T2, N2a, cM0)   SUMMARY OF ONCOLOGIC HISTORY:   Cancer of upper-outer quadrant of female breast   10/19/2012 Initial Diagnosis Cancer of upper-outer quadrant of female breast; IDC ER 7% PR 0% Ki-67 19% HER-2 positive ratio 2.83; lymph node biopsy positive for cancer   11/09/2012 Breast MRI 2.7 x 2.4 x 2.2 cm biopsy-proven invasive ductal carcinoma deep in the upper outer quadrant of the right breast.1.8 cm biopsy-proven metastatic level I right axillary lymph node.    11/23/2012 Surgery Right breast lumpectomy 2.7 cm moderately differentiated mammary cancer next ductal and lobular features associated LCIS, LVIS present margins negative   01/01/2013 - 02/22/2014 Radiation Therapy Radiation therapy to lumpectomy site   04/01/2013 -  Anti-estrogen oral therapy Letrozole 2.5 mg started 04/01/2013 plan is for 5 years of therapy   06/30/2014 Relapse/Recurrence Right breast mass biopsy invasive mammary cancer possibly lobular type ER 58% PR 0% Ki-67 18%    CHIEF COMPLIANT: Followup after recent biopsies on the right breast  INTERVAL HISTORY: Amber [name] is 80 year old American lady with above-mentioned history of right-sided breast cancer that was treated with right breast lumpectomy followed by radiation. She has been on oral antiestrogen therapy since May 2014. When she presented in August for followup, we noticed that her right breast nipple was inverted and a large density was felt in the breast. She was then referred for a mammogram and ultrasound that revealed a 4.7 cm mass that was biopsy-proven to be invasive mammary cancer possibly lobular type ER positive PR negative. She was then referred to see Dr. Ninfa Linden and  she has an appointment to see him on September 24. She is here to discuss the pathology report and to discuss the treatment plan.   REVIEW OF SYSTEMS:   Constitutional: Denies fevers, chills or abnormal weight loss Eyes: Denies blurriness of vision Ears, nose, mouth, throat, and face: Denies mucositis or sore throat Respiratory: Denies cough, dyspnea or wheezes Cardiovascular: Denies palpitation, chest discomfort or lower extremity swelling Gastrointestinal:  Denies nausea, heartburn or change in bowel habits Skin: Denies abnormal skin rashes Lymphatics: Denies new lymphadenopathy or easy bruising Neurological:Denies numbness, tingling or new weaknesses Behavioral/Psych: Mood is stable, no new changes  Breast: Large Palpable mass in the right breast All other systems were reviewed with the patient and are negative.  I have reviewed the past medical history, past surgical history, social history and family history with the patient and they are unchanged from previous note.  ALLERGIES:  has No Known Allergies.  MEDICATIONS:  Current Outpatient Prescriptions  Medication Sig Dispense Refill  . ADVAIR DISKUS 250-50 MCG/DOSE AEPB Inhale 1 puff into the lungs 2 (two) times daily.       Marland Kitchen albuterol (PROAIR HFA) 108 (90 BASE) MCG/ACT inhaler Inhale 1-2 puffs into the lungs every 4 (four) hours as needed for wheezing or shortness of breath. Use 2 puffs 3 times daily x 4 days then use as needed.  8.5 g  0  . amLODipine (NORVASC) 5 MG tablet Take 5 mg by mouth Daily.       Marland Kitchen aspirin 81 MG tablet Take 81 mg by mouth daily.      Marland Kitchen  cholecalciferol (VITAMIN D) 1000 UNITS tablet Take 1 tablet (1,000 Units total) by mouth daily.  90 tablet  12  . diazepam (VALIUM) 5 MG tablet Three times daily as needed.      . furosemide (LASIX) 80 MG tablet Take 80 mg by mouth Daily.       Marland Kitchen HYDROcodone-acetaminophen (NORCO/VICODIN) 5-325 MG per tablet Take 1 tablet by mouth every 4 (four) hours as needed.  40 tablet   0  . letrozole (FEMARA) 2.5 MG tablet TAKE 1 TABLET ONCE DAILY.  90 tablet  0  . Linaclotide (LINZESS PO) Take by mouth.      . potassium chloride SA (K-DUR,KLOR-CON) 20 MEQ tablet Take 2 tablets (40 mEq total) by mouth 2 (two) times daily.  20 tablet  0  . pravastatin (PRAVACHOL) 40 MG tablet Take 40 mg by mouth Daily.       . prochlorperazine (COMPAZINE) 5 MG tablet TAKE 1 TABLET EVERY 8 HOURS AS NEEDED FOR NAUSEA AND VOMITING.  30 tablet  3  . propranolol (INDERAL) 40 MG tablet Take 40 mg by mouth Daily.       Marland Kitchen SYNTHROID 150 MCG tablet Take 150 mcg by mouth Daily.       . traMADol (ULTRAM) 50 MG tablet Every 6 hours as needed.       No current facility-administered medications for this visit.    PHYSICAL EXAMINATION: ECOG PERFORMANCE STATUS: 1 - Symptomatic but completely ambulatory  Filed Vitals:   07/14/14 1436  BP: 128/54  Pulse: 70  Temp: 98.1 F (36.7 C)  Resp: 18   Filed Weights   07/14/14 1436  Weight: 193 lb 14.4 oz (87.952 kg)    GENERAL:alert, no distress and comfortable SKIN: skin color, texture, turgor are normal, no rashes or significant lesions EYES: normal, Conjunctiva are pink and non-injected, sclera clear OROPHARYNX:no exudate, no erythema and lips, buccal mucosa, and tongue normal  NECK: supple, thyroid normal size, non-tender, without nodularity LYMPH:  no palpable lymphadenopathy in the cervical, axillary or inguinal LUNGS: clear to auscultation and percussion with normal breathing effort HEART: regular rate & rhythm and no murmurs and no lower extremity edema soft ejection systolic murmur in the mitral area ABDOMEN:abdomen soft, non-tender and normal bowel sounds Musculoskeletal:no cyanosis of digits and no clubbing  NEURO: alert & oriented x 3 with fluent speech, no focal motor/sensory deficits   LABORATORY DATA:  I have reviewed the data as listed   Chemistry      Component Value Date/Time   NA 147* 07/04/2014 1001   NA 141 11/05/2013 0523   K  3.0* 07/04/2014 1001   K 4.4 11/05/2013 0523   CL 104 11/05/2013 0523   CL 103 04/01/2013 1039   CO2 30* 07/04/2014 1001   CO2 27 11/05/2013 0523   BUN 10.8 07/04/2014 1001   BUN 14 11/05/2013 0523   CREATININE 1.0 07/04/2014 1001   CREATININE 0.86 11/05/2013 0523      Component Value Date/Time   CALCIUM 9.7 07/04/2014 1001   CALCIUM 9.5 11/05/2013 0523   ALKPHOS 107 07/04/2014 1001   AST 15 07/04/2014 1001   ALT 11 07/04/2014 1001   BILITOT 0.48 07/04/2014 1001       Lab Results  Component Value Date   WBC 3.7* 07/04/2014   HGB 13.5 07/04/2014   HCT 40.4 07/04/2014   MCV 92.0 07/04/2014   PLT 140* 07/04/2014   NEUTROABS 2.3 07/04/2014     RADIOGRAPHIC STUDIES: I have personally reviewed the  radiology reports and agreed with their findings. MRI an ultrasound done at [facility] reveals 4.7 cm mass in the right breast biopsy-proven to be invasive mammary cancer  ASSESSMENT & PLAN:  Cancer of upper-outer quadrant of female breast Relapsed right breast cancer: Biopsy on the recurrent mass revealed 4.7 cm invasive mammary cancer ER positive PR negative HER-2 not done Ki-67 was 18%. In comparison to her original breast cancer diagnosed in December 2013 the current tumor was more ER positive than previously. Bili presenting her case in our multidisciplinary breast tumor board next Wednesday. I recommended that she undergo surgery with mastectomy followed by adjuvant antiestrogen therapy. She is not a candidate for any adjuvant chemotherapy because of her age and comorbidities. I would probably switch her antiestrogen therapy from letrozole to exemestane.  Return to clinic after surgery for followup.    No orders of the defined types were placed in this encounter.   The patient has a good understanding of the overall plan. she agrees with it. She will call with any problems that may develop before her next visit here.  I spent 40 minutes counseling the patient face to face. The total time spent in the appointment was 55  minutes and more than 50% was on counseling and review of test results    Rulon Eisenmenger, MD 07/14/2014 3:36 PM

## 2014-07-14 NOTE — Assessment & Plan Note (Signed)
Relapsed right breast cancer: Biopsy on the recurrent mass revealed 4.7 cm invasive mammary cancer ER positive PR negative HER-2 not done Ki-67 was 18%. In comparison to her original breast cancer diagnosed in December 2013 the current tumor was more ER positive than previously. Bili presenting her case in our multidisciplinary breast tumor board next Wednesday. I recommended that she undergo surgery with mastectomy followed by adjuvant antiestrogen therapy. She is not a candidate for any adjuvant chemotherapy because of her age and comorbidities. I would probably switch her antiestrogen therapy from letrozole to exemestane.  Return to clinic after surgery for followup.

## 2014-07-17 ENCOUNTER — Telehealth: Payer: Self-pay | Admitting: Hematology and Oncology

## 2014-07-17 NOTE — Telephone Encounter (Signed)
per pof cx appt...done...pt aware

## 2014-07-20 ENCOUNTER — Telehealth: Payer: Self-pay | Admitting: *Deleted

## 2014-07-20 NOTE — Telephone Encounter (Signed)
Report dated 07/11/14 received from [facility]. Copy given to Dr. Lindi Adie and original sent to HIM to be scanned into patient's chart.

## 2014-07-25 ENCOUNTER — Other Ambulatory Visit: Payer: Self-pay | Admitting: Hematology and Oncology

## 2014-07-26 ENCOUNTER — Encounter (HOSPITAL_COMMUNITY): Payer: Self-pay | Admitting: Pharmacy Technician

## 2014-07-26 ENCOUNTER — Other Ambulatory Visit (INDEPENDENT_AMBULATORY_CARE_PROVIDER_SITE_OTHER): Payer: Self-pay | Admitting: Surgery

## 2014-07-26 NOTE — Progress Notes (Signed)
Please put orders in Epic surgery 08-01-14 pre op 07-31-14 Thanks

## 2014-07-28 ENCOUNTER — Other Ambulatory Visit (HOSPITAL_COMMUNITY): Payer: Self-pay | Admitting: *Deleted

## 2014-07-28 NOTE — Patient Instructions (Addendum)
Amber [name]  07/28/2014                           YOUR PROCEDURE IS SCHEDULED ON: 08/01/14               ENTER THRU [facility] MAIN [facility]ITAL ENTRANCE AND                            FOLLOW  SIGNS TO SHORT STAY CENTER                 ARRIVE AT SHORT STAY AT:  7:30 AM               CALL THIS NUMBER IF ANY PROBLEMS THE DAY OF SURGERY :               [phone]                                REMEMBER:   Do not eat food or drink liquids AFTER MIDNIGHT             STOP ASPIRIN AND SUPPLEMENT 5 DAYS PREOP                  Take these medicines the morning of surgery with               A SIPS OF WATER :    USE ADVAIR INHALER / TAKE AMLODIPINE / PROPRANOLOL / SYNTHROID / LETROZOLE     Do not wear jewelry, make-up   Do not wear lotions, powders, or perfumes.   Do not shave legs or underarms 12 hrs. before surgery (men may shave face)  Do not bring valuables to the hospital.  Contacts, dentures or bridgework may not be worn into surgery.  Leave suitcase in the car. After surgery it may be brought to your room.  For patients admitted to the hospital more than one night, checkout time is            11:00 AM                                                       The day of discharge.   Patients discharged the day of surgery will not be allowed to drive home.            If going home same day of surgery, must have someone stay with you              FIRST 24 hrs at home and arrange for some one to drive you              home from hospital.   ________________________________________________________________________  [place]  Before surgery, you can play an important role.  Because skin is not sterile, your skin needs to be as free of germs as possible.  You can reduce the number of germs on your skin by washing with CHG (chlorahexidine gluconate)  soap before surgery.  CHG is an antiseptic cleaner which kills germs and bonds with the skin to continue killing germs even after washing. Please DO NOT use if you have an allergy to CHG or antibacterial soaps.  If your skin becomes reddened/irritated stop using the CHG and inform your nurse when you arrive at Short Stay. Do not shave (including legs and underarms) for at least 48 hours prior to the first CHG shower.  You may shave your face. Please follow these instructions carefully:   1.  Shower with CHG Soap the night before surgery and the  morning of Surgery.   2.  If you choose to wash your hair, wash your hair first as usual with your  normal  Shampoo.   3.  After you shampoo, rinse your hair and body thoroughly to remove the  shampoo.                                         4.  Use CHG as you would any other liquid soap.  You can apply chg directly  to the skin and wash . Gently wash with scrungie or clean wascloth    5.  Apply the CHG Soap to your body ONLY FROM THE NECK DOWN.   Do not use on open                           Wound or open sores. Avoid contact with eyes, ears mouth and genitals (private parts).                        Genitals (private parts) with your normal soap.              6.  Wash thoroughly, paying special attention to the area where your surgery  will be performed.   7.  Thoroughly rinse your body with warm water from the neck down.   8.  DO NOT shower/wash with your normal soap after using and rinsing off  the CHG Soap .                9.  Pat yourself dry with a clean towel.             10.  Wear clean pajamas.             11.  Place clean sheets on your bed the night of your first shower and do not  sleep with pets.  Day of Surgery : Do not apply any lotions/deodorants the morning of surgery.  Please wear clean clothes to the hospital/surgery center.  FAILURE TO FOLLOW THESE INSTRUCTIONS MAY RESULT IN THE CANCELLATION OF YOUR SURGERY    PATIENT  SIGNATURE_________________________________  ______________________________________________________________________

## 2014-07-31 ENCOUNTER — Encounter (HOSPITAL_COMMUNITY)
Admission: RE | Admit: 2014-07-31 | Discharge: 2014-07-31 | Disposition: A | Discharge status: Home / Self Care | Payer: Medicare PPO | Source: Ambulatory Visit | Attending: Surgery | Admitting: Surgery

## 2014-07-31 ENCOUNTER — Encounter (HOSPITAL_COMMUNITY): Payer: Self-pay

## 2014-07-31 DIAGNOSIS — K219 Gastro-esophageal reflux disease without esophagitis: Secondary | ICD-10-CM | POA: Insufficient documentation

## 2014-07-31 DIAGNOSIS — E7800 Pure hypercholesterolemia, unspecified: Secondary | ICD-10-CM | POA: Diagnosis not present

## 2014-07-31 DIAGNOSIS — C50911 Malignant neoplasm of unspecified site of right female breast: Principal | ICD-10-CM | POA: Insufficient documentation

## 2014-07-31 DIAGNOSIS — Z7982 Long term (current) use of aspirin: Secondary | ICD-10-CM | POA: Insufficient documentation

## 2014-07-31 DIAGNOSIS — C50919 Malignant neoplasm of unspecified site of unspecified female breast: Secondary | ICD-10-CM | POA: Diagnosis present

## 2014-07-31 DIAGNOSIS — J45909 Unspecified asthma, uncomplicated: Secondary | ICD-10-CM | POA: Insufficient documentation

## 2014-07-31 DIAGNOSIS — E079 Disorder of thyroid, unspecified: Secondary | ICD-10-CM | POA: Diagnosis not present

## 2014-07-31 DIAGNOSIS — F329 Major depressive disorder, single episode, unspecified: Secondary | ICD-10-CM | POA: Insufficient documentation

## 2014-07-31 DIAGNOSIS — E119 Type 2 diabetes mellitus without complications: Secondary | ICD-10-CM | POA: Diagnosis not present

## 2014-07-31 DIAGNOSIS — I10 Essential (primary) hypertension: Secondary | ICD-10-CM | POA: Insufficient documentation

## 2014-07-31 DIAGNOSIS — Z923 Personal history of irradiation: Secondary | ICD-10-CM | POA: Insufficient documentation

## 2014-07-31 DIAGNOSIS — F3289 Other specified depressive episodes: Secondary | ICD-10-CM | POA: Diagnosis not present

## 2014-07-31 DIAGNOSIS — Z79899 Other long term (current) drug therapy: Secondary | ICD-10-CM | POA: Diagnosis not present

## 2014-07-31 DIAGNOSIS — M129 Arthropathy, unspecified: Secondary | ICD-10-CM | POA: Diagnosis not present

## 2014-07-31 DIAGNOSIS — M1990 Unspecified osteoarthritis, unspecified site: Secondary | ICD-10-CM | POA: Insufficient documentation

## 2014-07-31 HISTORY — DX: Personal history of other infectious and parasitic diseases: Z86.19

## 2014-07-31 LAB — CBC
HCT: 41.8 % (ref 36.0–46.0)
Hemoglobin: 14.2 g/dL (ref 12.0–15.0)
MCH: 30.5 pg (ref 26.0–34.0)
MCHC: 34 g/dL (ref 30.0–36.0)
MCV: 89.7 fL (ref 78.0–100.0)
Platelets: 157 10*3/uL (ref 150–400)
RBC: 4.66 MIL/uL (ref 3.87–5.11)
RDW: 13.4 % (ref 11.5–15.5)
WBC: 5.6 10*3/uL (ref 4.0–10.5)

## 2014-07-31 LAB — COMPREHENSIVE METABOLIC PANEL
ALT: 11 U/L (ref 0–35)
AST: 19 U/L (ref 0–37)
Albumin: 3.6 g/dL (ref 3.5–5.2)
Alkaline Phosphatase: 129 U/L — ABNORMAL HIGH (ref 39–117)
Anion gap: 12 (ref 5–15)
BUN: 16 mg/dL (ref 6–23)
CO2: 31 mEq/L (ref 19–32)
Calcium: 9.7 mg/dL (ref 8.4–10.5)
Chloride: 99 mEq/L (ref 96–112)
Creatinine, Ser: 0.91 mg/dL (ref 0.50–1.10)
GFR calc Af Amer: 64 mL/min — ABNORMAL LOW (ref >90)
GFR calc non Af Amer: 56 mL/min — ABNORMAL LOW (ref >90)
Glucose, Bld: 111 mg/dL — ABNORMAL HIGH (ref 70–99)
Potassium: 3.9 mEq/L (ref 3.7–5.3)
Sodium: 142 mEq/L (ref 137–147)
Total Bilirubin: 0.5 mg/dL (ref 0.3–1.2)
Total Protein: 7.2 g/dL (ref 6.0–8.3)

## 2014-07-31 LAB — ABO/RH: ABO/RH(D): B POS

## 2014-07-31 NOTE — H&P (Signed)
osephine TAalaiyah [name] 07/25/2014 2:52 PM Location: [facility] Patient #: [id number] DOB: [birth year] Widowed / Language: [name] / Race: Black or African American Female  History of Present Illness (Kein Carlberg A. Ninfa Linden MD; 07/25/2014 3:39 PM) Patient words: pre-op visit.  The patient is a 80 year old female who presents with breast cancer. this is a pleasant female with a history of right breast cancer status post right breast lumpectomy and complete axillary dissection in January of 2014. She has recently developed a large right breast mass which on biopsy has been proven to be any other cancer. She has had prior radiation to the breast. She denies nipple discharge. She reports pain in the right breast. She has been followed closely by the oncologist.   Other Problems ([place], [place]; 07/25/2014 2:52 PM) Arthritis Asthma Back Pain Breast Cancer Chest pain Depression Diabetes Mellitus Gastroesophageal Reflux Disease High blood pressure Hypercholesterolemia Lump In Breast Oophorectomy Bilateral. Thyroid Disease  Past Surgical History Coletta Memos, [place]; 07/25/2014 2:52 PM) Breast Biopsy Right. Breast Mass; Local Excision Right. Cataract Surgery Bilateral. Foot Surgery Bilateral. Hysterectomy (not due to cancer) - Partial Sentinel Lymph Node Biopsy Spinal Surgery - Lower Back Spinal Surgery - Neck Spinal Surgery Midback Thyroid Surgery  Diagnostic Studies History ([place], [place]; 07/25/2014 2:52 PM) Colonoscopy >10 years ago Mammogram within last year Pap Smear >5 years ago  Allergies Alexander Bergeron [place], [place]; 07/25/2014 2:52 PM) No Known Drug Allergies09/22/2015  Medication History ([place], RMA; 07/25/2014 2:53 PM) Advair Diskus (500-50MCG/DOSE Aero Pow Br Act, Inhalation) Active. ProAir HFA (108 (90 Base)MCG/ACT Aerosol Soln, Inhalation) Active. Norvasc (5MG  Tablet, Oral)  Active. Aspirin EC (81MG  Tablet DR, Oral) Active. Vitamin D (Cholecalciferol) (1000UNIT Tablet, Oral) Active. Valium (5MG  Tablet, Oral) Active. Lasix (80MG  Tablet, Oral) Active. Vicodin (5-300MG  Tablet, Oral) Active. Femara (2.5MG  Tablet, Oral) Active. Linzess (Oral) Specific dose unknown - Active. Klor-Con [facility] Packet, Oral) Active. Pravachol (40MG  Tablet, Oral) Active. Compazine (5MG  Tablet, Oral) Active. Inderal (40MG  Tablet, Oral) Active. Synthroid (150MCG Tablet, Oral) Active. TraMADol HCl (50MG  Tablet, Oral) Active. Medications Reconciled  Social History Alexander Bergeron [place], [place]; 07/25/2014 2:52 PM) Caffeine use Carbonated beverages, Tea. No alcohol use No drug use Tobacco use Never smoker.  Family History ([place], [place]; 07/25/2014 2:52 PM) Alcohol Abuse Brother. Arthritis Daughter, Mother. Breast Cancer Daughter. Colon Polyps Daughter, Son. Diabetes Mellitus Daughter, Son. Heart Disease Daughter, Family Members In General. Heart disease in female family member before age 15 Hypertension Daughter, Son. Thyroid problems Daughter.  Pregnancy / Birth History Alexander Bergeron [place], [place]; 07/25/2014 2:52 PM) Age at menarche 24 years. Age of menopause >77 Gravida 3 Irregular periods Maternal age 29-25 Para 3  Review of Systems ([place] RMA; 07/25/2014 2:52 PM) General Present- Fatigue and Night Sweats. Not Present- Appetite Loss, Chills, Fever, Weight Gain and Weight Loss. Skin Present- Rash. Not Present- Change in Wart/Mole, Dryness, Hives, Jaundice, New Lesions, Non-Healing Wounds and Ulcer. HEENT Present- Hearing Loss, Sinus Pain and Wears glasses/contact lenses. Not Present- Earache, Hoarseness, Nose Bleed, Oral Ulcers, Ringing in the Ears, Seasonal Allergies, Sore Throat, Visual Disturbances and Yellow Eyes. Respiratory Present- Snoring and Wheezing. Not Present- Bloody sputum, Chronic Cough and Difficulty  Breathing. Breast Present- Breast Mass, Breast Pain, Nipple Discharge and Skin Changes. Cardiovascular Present- Leg Cramps and Shortness of Breath. Not Present- Chest Pain, Difficulty Breathing Lying Down, Palpitations, Rapid Heart Rate and Swelling of Extremities. Gastrointestinal Present- Abdominal Pain, Change in Bowel Habits and Constipation. Not Present- Bloating, Bloody Stool, Chronic diarrhea, Difficulty Swallowing, Excessive  gas, Gets full quickly at meals, Hemorrhoids, Indigestion, Nausea, Rectal Pain and Vomiting. Musculoskeletal Present- Back Pain, Joint Pain, Muscle Pain and Muscle Weakness. Not Present- Joint Stiffness and Swelling of Extremities. Neurological Present- Decreased Memory, Numbness, Trouble walking and Weakness. Not Present- Fainting, Headaches, Seizures, Tingling and Tremor. Psychiatric Present- Depression. Not Present- Anxiety, Bipolar, Change in Sleep Pattern, Fearful and Frequent crying. Endocrine Present- Hair Changes and Hot flashes. Not Present- Cold Intolerance, Excessive Hunger, Heat Intolerance and New Diabetes. Hematology Present- Gland problems. Not Present- Easy Bruising, Excessive bleeding, HIV and Persistent Infections.   Vitals (Dahionnarah Maldonado RMA; 07/25/2014 2:59 PM) 07/25/2014 2:58 PM Weight: 179 lb Height: 62in Body Surface Area: 1.88 m Body Mass Index: 32.74 kg/m Temp.: 98.9F(Oral)  Pulse: 67 (Regular)  BP: 136/82 (Sitting, Left Arm, Standard)    Physical Exam (Alan Riles A. Ninfa Linden MD; 07/25/2014 3:41 PM) The physical exam findings are as follows: Note:obese, elderly appearing female with minimal mobility  General Note: she is in no acute distress.   Head and Neck Note: there is no cervical adenopathy. Trachea is midline. External ears and nose are normal. Hearing is slightly diminished. Oropharynx is clear   Chest and Lung Exam Note: lungs clear toeffortltation bilaterally with normal respiratory  effort   Breast Note: there is a very large mass approximatht breast.n size at the 12:00 position of the right breast. There are skin changes consistent with her radiation. I cannot feel any enlarged mass in the axilla   Cardiovascular Note: regular rate and rhythm with no murmur or peripheral edema   Abdomen Note: obese, soft, nontender, nondistended   Neurologic Note: awake, alert, and oriented x3   Neuropsychiatric Note: judgment and affect are normal     Assessment & Plan (Vaunda Gutterman A. Ninfa Linden MD; 07/25/2014 3:43 PM) BREAST CANCER, RIGHT (174.9  C50.911) Impression: I have reviewed her mammograms and pathology results showing the invasive right mammary breast cancer. It is almost 5 cm in size. I discussed this in detail with the family. I agree with oncology that the best path would be to proceed with a right breast mastectomy for local control. I discussed t.e risk of surgery with the patient and her family. These risks include but are not limited to bleeding, infection, flap breakdown, need for furtmonary issues, et cetera.s are positive, cardiopulmonary issues, et Ronney Asters. She understands and wishes to proceed. Surgery will be scheduled

## 2014-08-01 ENCOUNTER — Encounter (HOSPITAL_COMMUNITY): Payer: Self-pay | Admitting: *Deleted

## 2014-08-01 ENCOUNTER — Ambulatory Visit (HOSPITAL_COMMUNITY): Payer: Medicare PPO | Admitting: Anesthesiology

## 2014-08-01 ENCOUNTER — Encounter (HOSPITAL_COMMUNITY): Payer: Medicare PPO | Admitting: Anesthesiology

## 2014-08-01 ENCOUNTER — Observation Stay (HOSPITAL_COMMUNITY)
Admission: RE | Admit: 2014-08-01 | Discharge: 2014-08-03 | Disposition: A | Discharge status: Skilled Nursing Facility | Payer: Medicare PPO | Source: Ambulatory Visit | Attending: Surgery | Admitting: Surgery

## 2014-08-01 ENCOUNTER — Encounter (HOSPITAL_COMMUNITY)
Admission: RE | Disposition: A | Discharge status: Skilled Nursing Facility | Payer: Self-pay | Source: Ambulatory Visit | Attending: Surgery

## 2014-08-01 DIAGNOSIS — C50919 Malignant neoplasm of unspecified site of unspecified female breast: Secondary | ICD-10-CM | POA: Diagnosis not present

## 2014-08-01 DIAGNOSIS — C50911 Malignant neoplasm of unspecified site of right female breast: Secondary | ICD-10-CM | POA: Diagnosis not present

## 2014-08-01 DIAGNOSIS — K219 Gastro-esophageal reflux disease without esophagitis: Secondary | ICD-10-CM | POA: Diagnosis not present

## 2014-08-01 DIAGNOSIS — F329 Major depressive disorder, single episode, unspecified: Secondary | ICD-10-CM | POA: Diagnosis not present

## 2014-08-01 DIAGNOSIS — E119 Type 2 diabetes mellitus without complications: Secondary | ICD-10-CM | POA: Diagnosis not present

## 2014-08-01 DIAGNOSIS — F3289 Other specified depressive episodes: Secondary | ICD-10-CM | POA: Diagnosis not present

## 2014-08-01 HISTORY — PX: SIMPLE MASTECTOMY WITH AXILLARY SENTINEL NODE BIOPSY: SHX6098

## 2014-08-01 LAB — GLUCOSE, CAPILLARY
GLUCOSE-CAPILLARY: 115 mg/dL — AB (ref 70–99)
Glucose-Capillary: 116 mg/dL — ABNORMAL HIGH (ref 70–99)

## 2014-08-01 LAB — TYPE AND SCREEN
ABO/RH(D): B POS
Antibody Screen: NEGATIVE

## 2014-08-01 SURGERY — SIMPLE MASTECTOMY
Anesthesia: General | Site: Breast | Laterality: Right

## 2014-08-01 MED ORDER — FENTANYL CITRATE 0.05 MG/ML IJ SOLN
INTRAMUSCULAR | Status: DC | PRN
Start: 2014-08-01 — End: 2014-08-01
  Administered 2014-08-01 (×5): 50 ug via INTRAVENOUS

## 2014-08-01 MED ORDER — ROCURONIUM BROMIDE 100 MG/10ML IV SOLN
INTRAVENOUS | Status: AC
  Filled 2014-08-01: qty 1

## 2014-08-01 MED ORDER — FENTANYL CITRATE 0.05 MG/ML IJ SOLN
INTRAMUSCULAR | Status: AC
  Filled 2014-08-01: qty 5

## 2014-08-01 MED ORDER — AMLODIPINE BESYLATE 5 MG PO TABS
5.0000 mg | ORAL_TABLET | Freq: Every day | ORAL | Status: DC
  Administered 2014-08-02 – 2014-08-03 (×2): 5 mg via ORAL
  Filled 2014-08-01 (×2): qty 1

## 2014-08-01 MED ORDER — ONDANSETRON HCL 4 MG/2ML IJ SOLN: 4.0000 mg | Freq: Four times a day (QID) | INTRAMUSCULAR | Status: DC | PRN

## 2014-08-01 MED ORDER — MOMETASONE FURO-FORMOTEROL FUM 100-5 MCG/ACT IN AERO
2.0000 | INHALATION_SPRAY | Freq: Two times a day (BID) | RESPIRATORY_TRACT | Status: DC
  Administered 2014-08-01 – 2014-08-03 (×4): 2 via RESPIRATORY_TRACT
  Filled 2014-08-01: qty 8.8

## 2014-08-01 MED ORDER — LINACLOTIDE 145 MCG PO CAPS
145.0000 ug | ORAL_CAPSULE | Freq: Every day | ORAL | Status: DC
  Administered 2014-08-01 – 2014-08-03 (×3): 145 ug via ORAL
  Filled 2014-08-01 (×3): qty 1

## 2014-08-01 MED ORDER — MEPERIDINE HCL 50 MG/ML IJ SOLN: 6.2500 mg | INTRAMUSCULAR | Status: DC | PRN

## 2014-08-01 MED ORDER — SIMVASTATIN 20 MG PO TABS
20.0000 mg | ORAL_TABLET | Freq: Every day | ORAL | Status: DC
  Administered 2014-08-01 – 2014-08-02 (×2): 20 mg via ORAL
  Filled 2014-08-01 (×3): qty 1

## 2014-08-01 MED ORDER — ALBUTEROL SULFATE HFA 108 (90 BASE) MCG/ACT IN AERS: 1.0000 | INHALATION_SPRAY | RESPIRATORY_TRACT | Status: DC | PRN

## 2014-08-01 MED ORDER — POTASSIUM CHLORIDE IN NACL 20-0.9 MEQ/L-% IV SOLN
INTRAVENOUS | Status: DC
  Administered 2014-08-01: 13:00:00 via INTRAVENOUS
  Filled 2014-08-01 (×3): qty 1000

## 2014-08-01 MED ORDER — LETROZOLE 2.5 MG PO TABS
2.5000 mg | ORAL_TABLET | Freq: Every day | ORAL | Status: DC
  Administered 2014-08-02 – 2014-08-03 (×2): 2.5 mg via ORAL
  Filled 2014-08-01 (×3): qty 1

## 2014-08-01 MED ORDER — LIDOCAINE HCL (CARDIAC) 20 MG/ML IV SOLN
INTRAVENOUS | Status: DC | PRN
  Administered 2014-08-01: 80 mg via INTRAVENOUS

## 2014-08-01 MED ORDER — HYDROCODONE-ACETAMINOPHEN 5-325 MG PO TABS
1.0000 | ORAL_TABLET | ORAL | Status: DC | PRN
  Administered 2014-08-01 – 2014-08-02 (×2): 2 via ORAL
  Administered 2014-08-02: 1 via ORAL
  Administered 2014-08-03: 2 via ORAL
  Filled 2014-08-01 (×2): qty 2
  Filled 2014-08-01: qty 1
  Filled 2014-08-01: qty 2

## 2014-08-01 MED ORDER — FUROSEMIDE 80 MG PO TABS
80.0000 mg | ORAL_TABLET | Freq: Every day | ORAL | Status: DC
  Administered 2014-08-02 – 2014-08-03 (×2): 80 mg via ORAL
  Filled 2014-08-01 (×2): qty 1

## 2014-08-01 MED ORDER — DIAZEPAM 5 MG PO TABS: 5.0000 mg | ORAL_TABLET | Freq: Three times a day (TID) | ORAL | Status: DC | PRN

## 2014-08-01 MED ORDER — GLYCOPYRROLATE 0.2 MG/ML IJ SOLN
INTRAMUSCULAR | Status: DC | PRN
  Administered 2014-08-01: .6 mg via INTRAVENOUS

## 2014-08-01 MED ORDER — ENOXAPARIN SODIUM 30 MG/0.3ML ~~LOC~~ SOLN
30.0000 mg | SUBCUTANEOUS | Status: DC
  Administered 2014-08-02 – 2014-08-03 (×2): 30 mg via SUBCUTANEOUS
  Filled 2014-08-01 (×3): qty 0.3

## 2014-08-01 MED ORDER — FENTANYL CITRATE 0.05 MG/ML IJ SOLN
25.0000 ug | INTRAMUSCULAR | Status: DC | PRN
  Administered 2014-08-01 (×4): 25 ug via INTRAVENOUS

## 2014-08-01 MED ORDER — ONDANSETRON HCL 4 MG PO TABS: 4.0000 mg | ORAL_TABLET | Freq: Four times a day (QID) | ORAL | Status: DC | PRN

## 2014-08-01 MED ORDER — FENTANYL CITRATE 0.05 MG/ML IJ SOLN
INTRAMUSCULAR | Status: AC
  Filled 2014-08-01: qty 2

## 2014-08-01 MED ORDER — CEFAZOLIN SODIUM-DEXTROSE 2-3 GM-% IV SOLR
INTRAVENOUS | Status: AC
  Filled 2014-08-01: qty 50

## 2014-08-01 MED ORDER — MORPHINE SULFATE 2 MG/ML IJ SOLN
1.0000 mg | INTRAMUSCULAR | Status: DC | PRN
  Administered 2014-08-01 (×2): 2 mg via INTRAVENOUS
  Filled 2014-08-01 (×2): qty 1

## 2014-08-01 MED ORDER — ROCURONIUM BROMIDE 100 MG/10ML IV SOLN
INTRAVENOUS | Status: DC | PRN
  Administered 2014-08-01: 40 mg via INTRAVENOUS

## 2014-08-01 MED ORDER — LIDOCAINE HCL (CARDIAC) 20 MG/ML IV SOLN
INTRAVENOUS | Status: AC
  Filled 2014-08-01: qty 5

## 2014-08-01 MED ORDER — PROMETHAZINE HCL 25 MG/ML IJ SOLN: 6.2500 mg | INTRAMUSCULAR | Status: DC | PRN

## 2014-08-01 MED ORDER — LEVOTHYROXINE SODIUM 150 MCG PO TABS
150.0000 ug | ORAL_TABLET | Freq: Every day | ORAL | Status: DC
  Administered 2014-08-02 – 2014-08-03 (×2): 150 ug via ORAL
  Filled 2014-08-01 (×3): qty 1

## 2014-08-01 MED ORDER — NEOSTIGMINE METHYLSULFATE 10 MG/10ML IV SOLN
INTRAVENOUS | Status: DC | PRN
  Administered 2014-08-01: 4 mg via INTRAVENOUS

## 2014-08-01 MED ORDER — MIDAZOLAM HCL 5 MG/5ML IJ SOLN
INTRAMUSCULAR | Status: DC | PRN
  Administered 2014-08-01: 1 mg via INTRAVENOUS

## 2014-08-01 MED ORDER — ALBUTEROL SULFATE (2.5 MG/3ML) 0.083% IN NEBU: 2.5000 mg | INHALATION_SOLUTION | RESPIRATORY_TRACT | Status: DC | PRN

## 2014-08-01 MED ORDER — PROPOFOL 10 MG/ML IV BOLUS
INTRAVENOUS | Status: DC | PRN
  Administered 2014-08-01 (×2): 20 mg via INTRAVENOUS
  Administered 2014-08-01: 120 mg via INTRAVENOUS
  Administered 2014-08-01: 20 mg via INTRAVENOUS

## 2014-08-01 MED ORDER — MIDAZOLAM HCL 2 MG/2ML IJ SOLN
INTRAMUSCULAR | Status: AC
  Filled 2014-08-01: qty 2

## 2014-08-01 MED ORDER — PROPRANOLOL HCL 40 MG PO TABS
40.0000 mg | ORAL_TABLET | Freq: Every day | ORAL | Status: DC
  Administered 2014-08-02 – 2014-08-03 (×2): 40 mg via ORAL
  Filled 2014-08-01 (×2): qty 1

## 2014-08-01 MED ORDER — CEFAZOLIN SODIUM-DEXTROSE 2-3 GM-% IV SOLR
2.0000 g | INTRAVENOUS | Status: AC
  Administered 2014-08-01: 2 g via INTRAVENOUS

## 2014-08-01 MED ORDER — PROPOFOL 10 MG/ML IV BOLUS
INTRAVENOUS | Status: AC
  Filled 2014-08-01: qty 20

## 2014-08-01 MED ORDER — ONDANSETRON HCL 4 MG/2ML IJ SOLN
INTRAMUSCULAR | Status: DC | PRN
  Administered 2014-08-01: 4 mg via INTRAVENOUS

## 2014-08-01 MED ORDER — LACTATED RINGERS IV SOLN
INTRAVENOUS | Status: DC | PRN
  Administered 2014-08-01: 09:00:00 via INTRAVENOUS

## 2014-08-01 SURGICAL SUPPLY — 45 items
APL SKNCLS STERI-STRIP NONHPOA (GAUZE/BANDAGES/DRESSINGS) ×1
APPLIER CLIP 11 MED OPEN (CLIP)
APR CLP MED 11 20 MLT OPN (CLIP)
BENZOIN TINCTURE PRP APPL 2/3 (GAUZE/BANDAGES/DRESSINGS) ×2 IMPLANT
BINDER BREAST LRG (GAUZE/BANDAGES/DRESSINGS) IMPLANT
BINDER BREAST XLRG (GAUZE/BANDAGES/DRESSINGS) ×2 IMPLANT
BLADE HEX COATED 2.75 (ELECTRODE) ×3 IMPLANT
CANISTER SUCTION 2500CC (MISCELLANEOUS) ×1 IMPLANT
CHLORAPREP W/TINT 26ML (MISCELLANEOUS) ×3 IMPLANT
CLIP APPLIE 11 MED OPEN (CLIP) IMPLANT
CLOSURE WOUND 1/2 X4 (GAUZE/BANDAGES/DRESSINGS) ×1
COVER MAYO STAND STRL (DRAPES) IMPLANT
COVER SURGICAL LIGHT HANDLE (MISCELLANEOUS) ×3 IMPLANT
DRAIN CHANNEL RND F F (WOUND CARE) ×4 IMPLANT
DRAPE LAPAROSCOPIC ABDOMINAL (DRAPES) ×2 IMPLANT
DRAPE SHEET LG 3/4 BI-LAMINATE (DRAPES) IMPLANT
DRAPE SPLIT 77X100IN (DRAPES) IMPLANT
DRSG PAD ABDOMINAL 8X10 ST (GAUZE/BANDAGES/DRESSINGS) IMPLANT
ELECT REM PT RETURN 9FT ADLT (ELECTROSURGICAL) ×3
ELECTRODE REM PT RTRN 9FT ADLT (ELECTROSURGICAL) ×1 IMPLANT
EVACUATOR SILICONE 100CC (DRAIN) ×6 IMPLANT
GAUZE SPONGE 4X4 12PLY STRL (GAUZE/BANDAGES/DRESSINGS) ×3 IMPLANT
GLOVE BIO SURGEON STRL SZ7 (GLOVE) ×1 IMPLANT
GLOVE BIOGEL PI IND STRL 7.5 (GLOVE) ×1 IMPLANT
GLOVE BIOGEL PI INDICATOR 7.5 (GLOVE)
GLOVE SURG SIGNA 7.5 PF LTX (GLOVE) ×3 IMPLANT
GOWN STRL REUS W/ TWL XL LVL3 (GOWN DISPOSABLE) ×1 IMPLANT
GOWN STRL REUS W/TWL LRG LVL3 (GOWN DISPOSABLE) ×4 IMPLANT
GOWN STRL REUS W/TWL XL LVL3 (GOWN DISPOSABLE) ×4 IMPLANT
KIT BASIN OR (CUSTOM PROCEDURE TRAY) ×3 IMPLANT
MARKER SKIN DUAL TIP RULER LAB (MISCELLANEOUS) ×3 IMPLANT
NS IRRIG 1000ML POUR BTL (IV SOLUTION) ×3 IMPLANT
PACK GENERAL/GYN (CUSTOM PROCEDURE TRAY) ×3 IMPLANT
PAD ABD 8X10 STRL (GAUZE/BANDAGES/DRESSINGS) ×2 IMPLANT
SPONGE DRAIN TRACH 4X4 STRL 2S (GAUZE/BANDAGES/DRESSINGS) IMPLANT
SPONGE LAP 18X18 X RAY DECT (DISPOSABLE) IMPLANT
STAPLER VISISTAT 35W (STAPLE) ×1 IMPLANT
STOCKINETTE 8 INCH (MISCELLANEOUS) IMPLANT
STRIP CLOSURE SKIN 1/2X4 (GAUZE/BANDAGES/DRESSINGS) ×1 IMPLANT
SUT ETHILON 2 0 PS N (SUTURE) ×4 IMPLANT
SUT MNCRL AB 4-0 PS2 18 (SUTURE) ×3 IMPLANT
SUT VIC AB 3-0 SH 18 (SUTURE) ×4 IMPLANT
TAPE CLOTH SURG 4X10 WHT LF (GAUZE/BANDAGES/DRESSINGS) ×2 IMPLANT
TOWEL OR 17X26 10 PK STRL BLUE (TOWEL DISPOSABLE) ×3 IMPLANT
TRAY FOLEY CATH 14FRSI W/METER (CATHETERS) IMPLANT

## 2014-08-01 NOTE — Transfer of Care (Signed)
Immediate Anesthesia Transfer of Care Note  Patient: Amber [name]  Procedure(s) Performed: Procedure(s): RIGHT MASTECTOMY (Right)  Patient Location: PACU  Anesthesia Type:General  Level of Consciousness: awake, alert  and oriented  Airway & Oxygen Therapy: Patient Spontanous Breathing and Patient connected to face mask oxygen  Post-op Assessment: Report given to PACU RN and Post -op Vital signs reviewed and stable  Post vital signs: Reviewed and stable  Complications: No apparent anesthesia complications

## 2014-08-01 NOTE — Op Note (Signed)
Amber [name], Amber [name] NO.:  [id number]  MEDICAL RECORD NO.:  [id number]  LOCATION:  5681                         FACILITY:  [facility]  PHYSICIAN:  Coralie Keens, M.D. DATE OF BIRTH:  April 25, 1926  DATE OF PROCEDURE:  08/01/2014 DATE OF DISCHARGE:                              OPERATIVE REPORT   PREOPERATIVE DIAGNOSIS:  Right breast cancer.  POSTOPERATIVE DIAGNOSIS:  Right breast cancer.  PROCEDURE:  Right total mastectomy.  SURGEON:  Coralie Keens, M.D.  ANESTHESIA:  General.  ESTIMATED BLOOD LOSS:  Minimal.  INDICATIONS:  This is an 80 year old female, who a year and half ago underwent an axillary dissection followed by postop radiation and who is on anti-hormonal therapies, who has unfortunately developed a new right breast cancer.  Decision was made to proceed with right mastectomy.  PROCEDURE IN DETAIL:  The patient was brought to the operating room and identified as Amber [name].  She was placed supine on the operating table and general anesthesia was induced.  Her right breast was prepped and draped in usual sterile fashion.  I made an elliptical incision on the right chest wall incorporating the areola and the previous scar in the lateral breast with a scalpel.  I took this down to the breast tissue with electrocautery.  I dissected the superior skin flap first. The patient had a lot of edema in the breast tissue as well as fibrosis. I was able to dissect the superior skin flaps to the level of the clavicle.  I then dissected the inferior skin flaps down to the inframammary ridge with electrocautery.  I then moved from medial to lateral, taking the skin flap down to the chest wall.  I then excised the remaining breast tissue off the pectoralis muscle with the electrocautery moving medial to lateral.  Once this was completed, I marked the lateral aspect of the mastectomy with a suture and a mastectomy was sent to Pathology for evaluation.  I  then irrigated the wound several times with saline.  I achieved hemostasis with cautery.  I then closed subcutaneous tissue with interrupted 3-0 Vicryl sutures and closed the skin with a running 4-0 Monocryl.  Prior to doing this, I did make several lateral skin incision and placed a suture to the Blake drain and placed 2 separate 2-0 nylon sutures.  Steri-Strips, gauze, ABD, and tape were then applied and the patient was placed in a binder.  The patient tolerated the procedure well.  All the counts were correct at the end of procedure.  The patient was then extubated in the operating room and taken in stable condition to recovery room.     Coralie Keens, M.D.     DB/MEDQ  D:  08/01/2014  T:  08/01/2014  Job:  [id number]

## 2014-08-01 NOTE — Interval H&P Note (Signed)
History and Physical Interval Note: no change in H and P  08/01/2014 8:29 AM  Amber [name]  has presented today for surgery, with the diagnosis of Right Breast Cancer  The various methods of treatment have been discussed with the patient and family. After consideration of risks, benefits and other options for treatment, the patient has consented to  Procedure(s): RIGHT MASTECTOMY (Right) as a surgical intervention .  The patient's history has been reviewed, patient examined, no change in status, stable for surgery.  I have reviewed the patient's chart and labs.  Questions were answered to the patient's satisfaction.     Kannan Proia A

## 2014-08-01 NOTE — Op Note (Signed)
RIGHT MASTECTOMY  Procedure Note  ASIYA CUTBIRTH 08/01/2014   Pre-op Diagnosis: Right Breast Cancer     Post-op Diagnosis: same  Procedure(s): RIGHT MASTECTOMY  Surgeon(s): Coralie Keens, MD  Anesthesia: General  Staff:  Circulator: Lou Miner, RN Scrub Person: Gregor Hams, CST  Estimated Blood Loss: Minimal               Specimens: sent to path          [facility] A   Date: 08/01/2014  Time: 10:45 AM

## 2014-08-01 NOTE — Progress Notes (Addendum)
Clinical Social Work Department CLINICAL SOCIAL WORK PLACEMENT NOTE 08/01/2014  Patient:  Amber [name], Amber [name]  Account Number:  [id number] Admit date:  08/01/2014  Clinical Social Worker:  Ulyess Blossom  Date/time:  08/01/2014 03:43 PM  Clinical Social Work is seeking post-discharge placement for this patient at the following level of care:   SKILLED NURSING   (*CSW will update this form in Epic as items are completed)   08/01/2014  Patient/family provided with [place] Department of Clinical Social Work's list of facilities offering this level of care within the geographic area requested by the patient (or if unable, by the patient's family).  08/01/2014  Patient/family informed of their freedom to choose among providers that offer the needed level of care, that participate in Medicare, Medicaid or managed care program needed by the patient, have an available bed and are willing to accept the patient.  08/01/2014  Patient/family informed of [facility]' ownership interest in [facility], as well as of the fact that they are under no obligation to receive care at this facility.  PASARR submitted to EDS on 08/01/2014 PASARR number received on 08/01/2014  FL2 transmitted to all facilities in geographic area requested by pt/family on  08/01/2014 FL2 transmitted to all facilities within larger geographic area on   Patient informed that his/her managed care company has contracts with or will negotiate with  certain facilities, including the following:     Patient/family informed of bed offers received:  08/02/2014 Patient chooses bed at [facility] Physician recommends and patient chooses bed at    Patient to be transferred to  on  [facility] on 08/03/2014 Patient to be transferred to facility by ambulance Corey Harold) Patient and family notified of transfer on 08/03/2014 Name of family member notified:  Pt notified at bedside and pt son, Reggie notified at bedside.  The  following physician request were entered in Epic:   Additional Comments:   [name], MSW, [place] Work [phone]

## 2014-08-01 NOTE — Anesthesia Postprocedure Evaluation (Signed)
  Anesthesia Post-op Note  Patient: Amber [name]  Procedure(s) Performed: Procedure(s) (LRB): RIGHT MASTECTOMY (Right)  Patient Location: PACU  Anesthesia Type: General  Level of Consciousness: awake and alert   Airway and Oxygen Therapy: Patient Spontanous Breathing  Post-op Pain: mild  Post-op Assessment: Post-op Vital signs reviewed, Patient's Cardiovascular Status Stable, Respiratory Function Stable, Patent Airway and No signs of Nausea or vomiting  Last Vitals:  Filed Vitals:   08/01/14 1228  BP: 130/66  Pulse: 66  Temp: 36.4 C  Resp: 14    Post-op Vital Signs: stable   Complications: No apparent anesthesia complications

## 2014-08-01 NOTE — Progress Notes (Signed)
Clinical Social Work Department BRIEF PSYCHOSOCIAL ASSESSMENT 08/01/2014  Patient:  Amber [name], Amber [name]     Account Number:  [id number]     Admit date:  08/01/2014  Clinical Social Worker:  Ulyess Blossom  Date/Time:  08/01/2014 03:21 PM  Referred by:  Physician  Date Referred:  08/01/2014 Referred for  SNF Placement   Other Referral:   Interview type:  Patient Other interview type:   and patient family at bedside    PSYCHOSOCIAL DATA Living Status:  ALONE Admitted from facility:   Level of care:   Primary support name:  Amber [name]/son/[phone] Primary support relationship to patient:  CHILD, ADULT Degree of support available:   strong    CURRENT CONCERNS Current Concerns  Post-Acute Placement   Other Concerns:    SOCIAL WORK ASSESSMENT / PLAN CSW received referral that pt family interested in short term rehab at discharge.    CSW reviewed chart and noted that pt s/p Right total mastectomy today. PT evaluation pending; will likely not be completed until 9/30. Pt observation status. Pt [employer] which requires insurance authorization.    CSW met with pt and multiple family members at bedside. CSW introduced self and explained role. Pt reports that surgery went smoothly today and pt family shared that they were seeing how pt tolerated some liquids to eat. CSW discussed with pt and pt family MD consult that pt family may be interested in rehab at [facility]. Pt son, Amber shared that pt family wants to do what is recommended by the doctor and PT. Pt daughter shared that pt lives alone and pt two daughter live near by to check on pt. CSW explained that PT evaluation pending and will be needed to determine if pt insurance will cover rehab at [facility]. Pt and pt  family expressed understanding.    CSW exited pt room and pt son, Amber followed CSW to speak alone. Pt son shared that he is concerned about pt at home as pt daughters do live near by, but are unable to provide 24 hour  care and pt son reports that pt daughter's visits to check on pt are not daily. Pt son shared that pt has had a fall recently which is a great concern to him. Emotional support provided. CSW discussed that CSW can explore options for SNF, but PT evaluation will determine if pt appropriate for SNF in order to initiate insurance authorization for chosen facility. Pt son expressed understanding and stated that he had researched [facility] and [facility]. CSW discussed that both facilities will be included in [facility] search.    CSW completed FL2 and initiated SNF search in [place].    CSW awaiting PT evaluation to determine if SNF will be appropriate, but initiated search in order to have option available if SNF recommendation.    CSW to follow up with pt and pt family regarding disposition planning.    CSW to continue to follow to provide support and assist with disposition planning.   Assessment/plan status:  Psychosocial Support/Ongoing Assessment of Needs Other assessment/ plan:   discharge planning   Information/referral to community resources:   [facility] list    PATIENT'S/FAMILY'S RESPONSE TO PLAN OF CARE: Pt alert and oriented x 4. Pt family want to follow recommendations of pt MD and PT. Pt son expressed concern about pt safety at home and hopeful that pt will be appropriate for rehab at [facility].    [name], MSW, [place] Work [phone]

## 2014-08-01 NOTE — Anesthesia Preprocedure Evaluation (Signed)
Anesthesia Evaluation  Patient identified by MRN, date of birth, ID band Patient awake    Reviewed: Allergy & Precautions, H&P , NPO status , Patient's Chart, lab work & pertinent test results  Airway Mallampati: II TM Distance: >3 FB Neck ROM: Full    Dental no notable dental hx. (+) Edentulous Upper, Edentulous Lower   Pulmonary asthma ,  breath sounds clear to auscultation  Pulmonary exam normal       Cardiovascular hypertension, Pt. on medications negative cardio ROS  Rhythm:Regular Rate:Normal     Neuro/Psych negative neurological ROS  negative psych ROS   GI/Hepatic negative GI ROS, Neg liver ROS,   Endo/Other  diabetes  Renal/GU negative Renal ROS  negative genitourinary   Musculoskeletal negative musculoskeletal ROS (+)   Abdominal   Peds negative pediatric ROS (+)  Hematology negative hematology ROS (+)   Anesthesia Other Findings   Reproductive/Obstetrics negative OB ROS                           Anesthesia Physical Anesthesia Plan  ASA: II  Anesthesia Plan: General   Post-op Pain Management:    Induction: Intravenous  Airway Management Planned: LMA  Additional Equipment:   Intra-op Plan:   Post-operative Plan: Extubation in OR  Informed Consent: I have reviewed the patients History and Physical, chart, labs and discussed the procedure including the risks, benefits and alternatives for the proposed anesthesia with the patient or authorized representative who has indicated his/her understanding and acceptance.   Dental advisory given  Plan Discussed with: CRNA  Anesthesia Plan Comments:         Anesthesia [place] Evaluation

## 2014-08-02 ENCOUNTER — Encounter (HOSPITAL_COMMUNITY): Payer: Self-pay | Admitting: Surgery

## 2014-08-02 ENCOUNTER — Other Ambulatory Visit: Payer: Self-pay

## 2014-08-02 DIAGNOSIS — C50919 Malignant neoplasm of unspecified site of unspecified female breast: Secondary | ICD-10-CM | POA: Diagnosis not present

## 2014-08-02 LAB — BASIC METABOLIC PANEL
Anion gap: 9 (ref 5–15)
BUN: 11 mg/dL (ref 6–23)
CHLORIDE: 104 meq/L (ref 96–112)
CO2: 30 mEq/L (ref 19–32)
CREATININE: 1.02 mg/dL (ref 0.50–1.10)
Calcium: 9 mg/dL (ref 8.4–10.5)
GFR, EST AFRICAN AMERICAN: 56 mL/min — AB (ref >90)
GFR, EST NON AFRICAN AMERICAN: 48 mL/min — AB (ref >90)
Glucose, Bld: 120 mg/dL — ABNORMAL HIGH (ref 70–99)
POTASSIUM: 3.5 meq/L — AB (ref 3.7–5.3)
Sodium: 143 mEq/L (ref 137–147)

## 2014-08-02 LAB — CBC
HCT: 36.1 % (ref 36.0–46.0)
Hemoglobin: 12 g/dL (ref 12.0–15.0)
MCH: 30.6 pg (ref 26.0–34.0)
MCHC: 33.2 g/dL (ref 30.0–36.0)
MCV: 92.1 fL (ref 78.0–100.0)
PLATELETS: 142 10*3/uL — AB (ref 150–400)
RBC: 3.92 MIL/uL (ref 3.87–5.11)
RDW: 14.1 % (ref 11.5–15.5)
WBC: 4.1 10*3/uL (ref 4.0–10.5)

## 2014-08-02 NOTE — Progress Notes (Signed)
Patient ID: DAWT REEB, female   DOB: 1926-01-17, 80 y.o.   MRN: [id number]  Pt stable Should be ready for rehab tomorrow Appreciated SW and PT's help

## 2014-08-02 NOTE — Progress Notes (Signed)
UR completed 

## 2014-08-02 NOTE — Evaluation (Signed)
Physical Therapy Evaluation Patient Details Name: Amber [name] MRN: [id number] DOB: 1926/07/15 Today's Date: 08/02/2014   History of Present Illness  Pt is an 80 year old female with hx of R axillary dissection followed by postop radiation and who is on anti-hormonal therapies, who has unfortunately developed a new right breat cancer and now s/p  s/p R total mastectomy    Clinical Impression  Pt s/p R total mastectomy. Pt currently with functional limitations due to the deficits listed below (see PT Problem List).  Pt will benefit from skilled PT to increase their independence and safety with mobility to allow discharge to the venue listed below.  Pt reports being R handed and states she is a little sore from surgery but no significant pain.  Pt requires increased time to mobilize and ambulate.  Discussed benefits of d/c to SNF for rehab as pt from home alone and would not have 24/7 assist.      Follow Up Recommendations SNF    Equipment Recommendations  None recommended by PT    Recommendations for Other Services       Precautions / Restrictions Precautions Precautions: Fall Restrictions Other Position/Activity Restrictions: no strenuous activity; no ROM or WBing restrictions per Dr. Ninfa Linden by telephone      Mobility  Bed Mobility Overal bed mobility: Needs Assistance Bed Mobility: Supine to Sit     Supine to sit: Supervision     General bed mobility comments: increased time and verbal cues for technique  Transfers Overall transfer level: Needs assistance Equipment used: Rolling walker (2 wheeled) Transfers: Sit to/from Stand Sit to Stand: Min guard         General transfer comment: verbal cues for hand placement  Ambulation/Gait Ambulation/Gait assistance: Min guard Ambulation Distance (Feet): 100 Feet Assistive device: Rolling walker (2 wheeled) Gait Pattern/deviations: Step-through pattern Gait velocity: decr   General Gait Details: very slow gait  speed, verbal cues for RW positioning  Stairs            Wheelchair Mobility    Modified Rankin (Stroke Patients Only)       Balance Overall balance assessment: History of Falls (per chart review)                                           Pertinent Vitals/Pain Pain Assessment: 0-10 Pain Score: 2  Pain Location: R axilla, breast Pain Descriptors / Indicators: Sore Pain Intervention(s): Limited activity within patient's tolerance;Monitored during session;Repositioned    Home Living Family/patient expects to be discharged to:: Unsure Living Arrangements: Alone   Type of Home: House Home Access: Stairs to enter   [employer] of Steps: 3 Home Layout: One level Home Equipment: Walker - 2 wheels;Cane - single point      Prior Function Level of Independence: Independent with assistive device(s)         Comments: pt states she uses SPC, son states she uses when she remembers     Hand Dominance        Extremity/Trunk Assessment   Upper Extremity Assessment: RUE deficits/detail RUE Deficits / Details: pt reports being R handed, able to perform mobility without difficulty however did not test above 90*         Lower Extremity Assessment: Generalized weakness         Communication   Communication: No difficulties  Cognition Arousal/Alertness: Awake/alert Behavior During  Therapy: WFL for tasks assessed/performed Overall Cognitive Status: Within Functional Limits for tasks assessed                      General Comments      Exercises        Assessment/Plan    PT Assessment Patient needs continued PT services  PT Diagnosis Difficulty walking   PT Problem List Decreased strength;Decreased activity tolerance;Decreased mobility;Decreased knowledge of use of DME  PT Treatment Interventions DME instruction;Gait training;Functional mobility training;Therapeutic activities;Therapeutic exercise;Patient/family  education   PT Goals (Current goals can be found in the Care Plan section) Acute Rehab PT Goals PT Goal Formulation: With patient Time For Goal Achievement: 08/09/14 Potential to Achieve Goals: Good    Frequency Min 3X/week   Barriers to discharge        Co-evaluation               End of Session Equipment Utilized During Treatment: Gait belt Activity Tolerance: Patient limited by fatigue Patient left: in chair;with call bell/phone within reach;with family/visitor present      Functional Assessment Tool Used: clinical judgement Functional Limitation: Mobility: Walking and moving around Mobility: Walking and Moving Around Current Status ([id number]): At least 1 percent but less than 20 percent impaired, limited or restricted Mobility: Walking and Moving Around Goal Status [phone]): 0 percent impaired, limited or restricted    Time: 0937-1004 PT Time Calculation (min): 27 min   Charges:   PT Evaluation $Initial PT Evaluation Tier I: 1 Procedure PT Treatments $Gait Training: 23-37 mins   PT G Codes:   Functional Assessment Tool Used: clinical judgement Functional Limitation: Mobility: Walking and moving around    Briar Chapel E 08/02/2014, 10:18 AM Carmelia Bake, PT, DPT 08/02/2014 Pager: [phone]

## 2014-08-02 NOTE — Progress Notes (Addendum)
CSW continuing to follow for disposition planning.   PT evaluation completed and recommendation for SNF.   CSW met with pt and pt son, Reggie extensively at bedside. CSW discussed recommendation from MD and PT for short term rehab at [facility]. Pt initially hesitant about rehab placement as she wished to return home. CSW discussed safety concerns with pt as pt lives alone and discussed that MD concerned as pt high fall risk and per MD, the breast flaps may be tenuous given her prior radiation. Pt shared that she wants to do what is best of her and what the medical team is recommending and agreeable to short term rehab at [facility]. Pt states that she wants to stay at rehab only one week. CSW provided support and provided positive reinforcement to pt for considering rehab at [facility].   CSW reviewed SNF bed offers with pt and pt son.  Pt agreeable to [facility].  CSW notified facility who confirmed availability. [place] initiated [employer] authorization. Pt requires insurance authorization prior to pt transition to SNF.  CSW to continue to follow to provide support and assist with pt transition to [facility].  Addendum 3:28 pm:  CSW followed up with [facility]. Facility has not yet received response from pt insurance regarding authorization. [facility] and [facility] this CSW.   Pt son updated.   CSW to continue to follow.  [name], MSW, [place] Work [phone]

## 2014-08-02 NOTE — Progress Notes (Signed)
1 Day Post-Op  Subjective: POD#1 Still with moderate discomfort  Objective: Vital signs in last 24 hours: Temp:  [97.4 F (36.3 C)-99 F (37.2 C)] 99 F (37.2 C) (09/30 0610) Pulse Rate:  [61-72] 70 (09/30 0610) Resp:  [12-16] 16 (09/30 0610) BP: (114-130)/(50-66) 114/51 mmHg (09/30 0610) SpO2:  [95 %-100 %] 96 % (09/30 0610)    Intake/Output from previous day: 09/29 0701 - 09/30 0700 In: 2040.8 [I.V.:2040.8] Out: 475 [Urine:400; Drains:75] Intake/Output this shift: Total I/O In: -  Out: 400 [Urine:400]  Drain serosang Flaps on right   Lab Results:   Recent Labs  07/31/14 1500 08/02/14 0428  WBC 5.6 4.1  HGB 14.2 12.0  HCT 41.8 36.1  PLT 157 142*   BMET  Recent Labs  07/31/14 1500 08/02/14 0428  NA 142 143  K 3.9 3.5*  CL 99 104  CO2 31 30  GLUCOSE 111* 120*  BUN 16 11  CREATININE 0.91 1.02  CALCIUM 9.7 9.0   PT/INR No results found for this basename: LABPROT, INR,  in the last 72 hours ABG No results found for this basename: PHART, PCO2, PO2, HCO3,  in the last 72 hours  Studies/Results: No results found.  Anti-infectives: Anti-infectives   Start     Dose/Rate Route Frequency Ordered Stop   08/01/14 0751  ceFAZolin (ANCEF) IVPB 2 g/50 mL premix     2 g 100 mL/hr over 30 Minutes Intravenous On call to O.R. 08/01/14 0751 08/01/14 0925      Assessment/Plan: s/p Procedure(s): RIGHT MASTECTOMY (Right)  PT to evaluate patient today Would like short term rehab as I think she is high risk for fall and the breast flaps may be tenuous given her prior radiation Disposition planning in progress.  LOS: 1 day    Connie Lasater A 08/02/2014

## 2014-08-03 ENCOUNTER — Other Ambulatory Visit: Payer: Medicare PPO

## 2014-08-03 ENCOUNTER — Ambulatory Visit: Payer: Medicare PPO | Admitting: Hematology and Oncology

## 2014-08-03 DIAGNOSIS — C50911 Malignant neoplasm of unspecified site of right female breast: Secondary | ICD-10-CM | POA: Diagnosis present

## 2014-08-03 DIAGNOSIS — Z923 Personal history of irradiation: Secondary | ICD-10-CM | POA: Diagnosis not present

## 2014-08-03 DIAGNOSIS — E119 Type 2 diabetes mellitus without complications: Secondary | ICD-10-CM | POA: Diagnosis not present

## 2014-08-03 DIAGNOSIS — E079 Disorder of thyroid, unspecified: Secondary | ICD-10-CM | POA: Diagnosis not present

## 2014-08-03 DIAGNOSIS — E780 Pure hypercholesterolemia: Secondary | ICD-10-CM | POA: Diagnosis not present

## 2014-08-03 DIAGNOSIS — Z79899 Other long term (current) drug therapy: Secondary | ICD-10-CM | POA: Diagnosis not present

## 2014-08-03 DIAGNOSIS — K219 Gastro-esophageal reflux disease without esophagitis: Secondary | ICD-10-CM | POA: Diagnosis not present

## 2014-08-03 DIAGNOSIS — I10 Essential (primary) hypertension: Secondary | ICD-10-CM | POA: Diagnosis not present

## 2014-08-03 DIAGNOSIS — M1990 Unspecified osteoarthritis, unspecified site: Secondary | ICD-10-CM | POA: Diagnosis not present

## 2014-08-03 DIAGNOSIS — J45909 Unspecified asthma, uncomplicated: Secondary | ICD-10-CM | POA: Diagnosis not present

## 2014-08-03 DIAGNOSIS — F329 Major depressive disorder, single episode, unspecified: Secondary | ICD-10-CM | POA: Diagnosis not present

## 2014-08-03 DIAGNOSIS — Z7982 Long term (current) use of aspirin: Secondary | ICD-10-CM | POA: Diagnosis not present

## 2014-08-03 LAB — BASIC METABOLIC PANEL
ANION GAP: 10 (ref 5–15)
BUN: 11 mg/dL (ref 6–23)
CALCIUM: 8.9 mg/dL (ref 8.4–10.5)
CO2: 30 mEq/L (ref 19–32)
Chloride: 108 mEq/L (ref 96–112)
Creatinine, Ser: 0.91 mg/dL (ref 0.50–1.10)
GFR, EST AFRICAN AMERICAN: 64 mL/min — AB (ref >90)
GFR, EST NON AFRICAN AMERICAN: 56 mL/min — AB (ref >90)
GLUCOSE: 132 mg/dL — AB (ref 70–99)
POTASSIUM: 3.3 meq/L — AB (ref 3.7–5.3)
SODIUM: 148 meq/L — AB (ref 137–147)

## 2014-08-03 LAB — CBC
HCT: 35.8 % — ABNORMAL LOW (ref 36.0–46.0)
Hemoglobin: 11.6 g/dL — ABNORMAL LOW (ref 12.0–15.0)
MCH: 30.1 pg (ref 26.0–34.0)
MCHC: 32.4 g/dL (ref 30.0–36.0)
MCV: 93 fL (ref 78.0–100.0)
PLATELETS: 129 10*3/uL — AB (ref 150–400)
RBC: 3.85 MIL/uL — ABNORMAL LOW (ref 3.87–5.11)
RDW: 13.9 % (ref 11.5–15.5)
WBC: 4.3 10*3/uL (ref 4.0–10.5)

## 2014-08-03 MED ORDER — HYDROCODONE-ACETAMINOPHEN 5-325 MG PO TABS: 1.0000 | ORAL_TABLET | ORAL | Status: DC | PRN

## 2014-08-03 NOTE — Progress Notes (Signed)
CSW received notification from [facility] that facility received insurance authorization from [facility].  CSW discussed with pt and pt son, Reggie at bedside.  CSW facilitated pt discharge needs including contacting facility, faxing pt discharge information via [facility], discussing with pt and pt son, Reggie at bedside, providing RN phone number to call report, and arranging ambulance transport via [facility] to [facility].  Pt coping well with transition to [facility]. Pt motivated to participate with PT in order to get back home. Pt is aiming to be able to return home after a week of rehab. Pt son appreciative of CSW support and assistance with discharge planning process.  No further social work needs identified at this time.  CSW signing off.   [name], MSW, [place] Work [phone]

## 2014-08-03 NOTE — Discharge Instructions (Signed)
[facility], [place] [id number]  MASTECTOMY: POST OP INSTRUCTIONS  Always review your discharge instruction sheet given to you by the facility where your surgery was performed. IF YOU HAVE DISABILITY OR FAMILY LEAVE FORMS, YOU MUST BRING THEM TO THE OFFICE FOR PROCESSING.   DO NOT GIVE THEM TO YOUR DOCTOR. A prescription for pain medication may be given to you upon discharge.  Take your pain medication as prescribed, if needed.  If narcotic pain medicine is not needed, then you may take acetaminophen (Tylenol) or ibuprofen (Advil) as needed. 1. Take your usually prescribed medications unless otherwise directed. 2. If you need a refill on your pain medication, please contact your pharmacy.  They will contact our office to request authorization.  Prescriptions will not be filled after 5pm or on week-ends. 3. You should follow a light diet the first few days after arrival home, such as soup and crackers, etc.  Resume your normal diet the day after surgery. 4. Most patients will experience some swelling and bruising on the chest and underarm.  Ice packs will help.  Swelling and bruising can take several days to resolve.  5. It is common to experience some constipation if taking pain medication after surgery.  Increasing fluid intake and taking a stool softener (such as Colace) will usually help or prevent this problem from occurring.  A mild laxative (Milk of Magnesia or Miralax) should be taken according to package instructions if there are no bowel movements after 48 hours. 6. Unless discharge instructions indicate otherwise, leave your bandage dry and in place until your next appointment in 3-5 days.  You may take a limited sponge bath.  No tube baths or showers until the drains are removed.  You may have steri-strips (small skin tapes) in place directly over the incision.  These strips should be left on the skin for 7-10 days.  If your surgeon used skin glue on the incision, you may  shower in 24 hours.  The glue will flake off over the next 2-3 weeks.  Any sutures or staples will be removed at the office during your follow-up visit. 7. DRAINS:  If you have drains in place, it is important to keep a list of the amount of drainage produced each day in your drains.  Before leaving the hospital, you should be instructed on drain care.  Call our office if you have any questions about your drains. 8. ACTIVITIES:  You may resume regular (light) daily activities beginning the next day--such as daily self-care, walking, climbing stairs--gradually increasing activities as tolerated.  You may have sexual intercourse when it is comfortable.  Refrain from any heavy lifting or straining until approved by your doctor. a. You may drive when you are no longer taking prescription pain medication, you can comfortably wear a seatbelt, and you can safely maneuver your car and apply brakes. b. RETURN TO WORK:  __________________________________________________________ 9. You should see your doctor in the office for a follow-up appointment approximately 3-5 days after your surgery.  Your doctors nurse will typically make your follow-up appointment when she calls you with your pathology report.  Expect your pathology report 2-3 business days after your surgery.  You may call to check if you do not hear from [place] after three days.   10. OTHER INSTRUCTIONS: ___OK TO REMOVE CURRENT BANDAGE.  REPLACE WITH DRY GUAZE AS NEEDED OR FOR COMFORT.  BINDER FOR COMFORT. 11. PATIENT MAY SHOWER, SPONGE BATHE, OK TO GET THE RIGHT CHEST AND DRAIN WET.  12. ___________________________________________________________________________________________ ____________________________________________________________________________________________ WHEN TO CALL YOUR DOCTOR: 1. Fever over 101.0 2. Nausea and/or vomiting 3. Extreme swelling or bruising 4. Continued bleeding from incision. 5. Increased pain, redness, or drainage from  the incision. The clinic staff is available to answer your questions during regular business hours.  Please dont hesitate to call and ask to speak to one of the nurses for clinical concerns.  If you have a medical emergency, go to the nearest emergency room or call 911.  A surgeon from [facility] is always on call at the hospital. [address], [place], [place], [place]  [address] ? [address], [place], [place]   [address] [phone] ? [phone] ? FAX [phone] Web site: [email or web address]

## 2014-08-03 NOTE — Discharge Summary (Signed)
Physician Discharge Summary  Patient ID: Amber [name] MRN: [id number] DOB/AGE: [birth year] 80 y.o.  Admit date: 08/01/2014 Discharge date: 08/03/2014  Admission Diagnoses:  Discharge Diagnoses:  Active Problems:   Breast cancer MORBID OBESITY   Discharged Condition: good  Hospital Course: Patient was admitted post op s/p mastectomy.  PT and social worker consulted post op.  Family requested short term rehab as patient lives alone and has fallen.  Patient improved with pain control and flaps remained viable.  JP drain output normal.  Discharged to rehab.  Consults: none  Significant Diagnostic Studies:   Treatments: surgery: right total mastectomy  Discharge Exam: Blood pressure 116/54, pulse 65, temperature 98.1 F (36.7 C), temperature source Oral, resp. rate 16, SpO2 98.00%. General appearance: alert, cooperative and no distress Resp: clear to auscultation bilaterally Cardio: regular rate and rhythm, S1, S2 normal, no murmur, click, rub or gallop Incision/Wound: mastectomy flaps viable, drain serosangenous  Disposition: 06-Home-Health Care Svc Drain care per routine.  Please try to record drain output daily.     Medication List         albuterol 108 (90 BASE) MCG/ACT inhaler  Commonly known as:  PROVENTIL HFA;VENTOLIN HFA  Inhale 1-2 puffs into the lungs every 4 (four) hours as needed for wheezing or shortness of breath.     amLODipine 5 MG tablet  Commonly known as:  NORVASC  Take 5 mg by mouth every morning.     aspirin 81 MG tablet  Take 81 mg by mouth daily.     cholecalciferol 1000 UNITS tablet  Commonly known as:  VITAMIN D  Take 1,000 Units by mouth daily.     diazepam 5 MG tablet  Commonly known as:  VALIUM  Take 5 mg by mouth Three times daily as needed for anxiety.     Fluticasone-Salmeterol 250-50 MCG/DOSE Aepb  Commonly known as:  ADVAIR  Inhale 1 puff into the lungs 2 (two) times daily.     furosemide 80 MG tablet  Commonly known as:   LASIX  Take 80 mg by mouth every morning.     HYDROcodone-acetaminophen 5-325 MG per tablet  Commonly known as:  NORCO/VICODIN  Take 1 tablet by mouth every 4 (four) hours as needed for moderate pain.     HYDROcodone-acetaminophen 5-325 MG per tablet  Commonly known as:  NORCO/VICODIN  Take 1-2 tablets by mouth every 4 (four) hours as needed for moderate pain.     letrozole 2.5 MG tablet  Commonly known as:  FEMARA  Take 2.5 mg by mouth every morning.     LINZESS 145 MCG Caps capsule  Generic drug:  Linaclotide  Take 145 mcg by mouth daily.     potassium chloride SA 20 MEQ tablet  Commonly known as:  K-DUR,KLOR-CON  Take 40 mEq by mouth 2 (two) times daily.     pravastatin 40 MG tablet  Commonly known as:  PRAVACHOL  Take 40 mg by mouth every morning.     prochlorperazine 5 MG tablet  Commonly known as:  COMPAZINE  Take 5 mg by mouth every 8 (eight) hours as needed for nausea or vomiting.     propranolol 40 MG tablet  Commonly known as:  INDERAL  Take 40 mg by mouth every morning.     SYNTHROID 150 MCG tablet  Generic drug:  levothyroxine  Take 150 mcg by mouth every morning.     traMADol 50 MG tablet  Commonly known as:  ULTRAM  Take 50 mg by  mouth Every 6 hours as needed for moderate pain.           Follow-up Information   Follow up with [facility], MD. Schedule an appointment as soon as possible for a visit on 08/17/2014. (For wound re-check)    Specialty:  General Surgery   Contact information:   [address] [place] [address] [phone]       Signed: Harl Bowie 08/03/2014, 7:36 AM

## 2014-08-03 NOTE — Progress Notes (Signed)
2 Days Post-Op  Subjective: More comfortable  Objective: Vital signs in last 24 hours: Temp:  [98.1 F (36.7 C)-99.1 F (37.3 C)] 98.1 F (36.7 C) (10/01 0553) Pulse Rate:  [65-76] 65 (10/01 0553) Resp:  [15-16] 16 (10/01 0553) BP: (99-127)/(51-62) 116/54 mmHg (10/01 0553) SpO2:  [92 %-98 %] 98 % (10/01 0553) Last BM Date:  (PTA)  Intake/Output from previous day: 09/30 0701 - 10/01 0700 In: 2193.8 [P.O.:840; I.V.:1333.8] Out: 1165 [Urine:1100; Drains:65] Intake/Output this shift:    Drain serosang Dressing pulled back, flaps viable  Lab Results:   Recent Labs  08/02/14 0428 08/03/14 0415  WBC 4.1 4.3  HGB 12.0 11.6*  HCT 36.1 35.8*  PLT 142* 129*   BMET  Recent Labs  08/02/14 0428 08/03/14 0415  NA 143 148*  K 3.5* 3.3*  CL 104 108  CO2 30 30  GLUCOSE 120* 132*  BUN 11 11  CREATININE 1.02 0.91  CALCIUM 9.0 8.9   PT/INR No results found for this basename: LABPROT, INR,  in the last 72 hours ABG No results found for this basename: PHART, PCO2, PO2, HCO3,  in the last 72 hours  Studies/Results: No results found.  Anti-infectives: Anti-infectives   Start     Dose/Rate Route Frequency Ordered Stop   08/01/14 0751  ceFAZolin (ANCEF) IVPB 2 g/50 mL premix     2 g 100 mL/hr over 30 Minutes Intravenous On call to O.R. 08/01/14 0751 08/01/14 0925      Assessment/Plan: s/p Procedure(s): RIGHT MASTECTOMY (Right)  appreciate PT's and social worker's involvement Discharge today to short term rehab secondary to mobility, self care concerns, surgical recovery   LOS: 2 days    Vicktoria Muckey A 08/03/2014

## 2014-08-04 ENCOUNTER — Other Ambulatory Visit: Payer: Self-pay | Admitting: *Deleted

## 2014-08-04 ENCOUNTER — Non-Acute Institutional Stay (SKILLED_NURSING_FACILITY): Payer: Medicare PPO | Admitting: Adult Health

## 2014-08-04 ENCOUNTER — Telehealth: Payer: Self-pay | Admitting: Hematology and Oncology

## 2014-08-04 DIAGNOSIS — C50411 Malignant neoplasm of upper-outer quadrant of right female breast: Secondary | ICD-10-CM

## 2014-08-04 DIAGNOSIS — I10 Essential (primary) hypertension: Secondary | ICD-10-CM

## 2014-08-04 DIAGNOSIS — R609 Edema, unspecified: Secondary | ICD-10-CM

## 2014-08-04 DIAGNOSIS — J45909 Unspecified asthma, uncomplicated: Secondary | ICD-10-CM

## 2014-08-04 DIAGNOSIS — K5900 Constipation, unspecified: Secondary | ICD-10-CM

## 2014-08-04 DIAGNOSIS — E039 Hypothyroidism, unspecified: Secondary | ICD-10-CM

## 2014-08-04 DIAGNOSIS — E785 Hyperlipidemia, unspecified: Secondary | ICD-10-CM

## 2014-08-04 DIAGNOSIS — R5381 Other malaise: Secondary | ICD-10-CM

## 2014-08-04 MED ORDER — HYDROCODONE-ACETAMINOPHEN 5-325 MG PO TABS: ORAL_TABLET | ORAL | Status: DC

## 2014-08-04 MED ORDER — DIAZEPAM 5 MG PO TABS: ORAL_TABLET | ORAL | Status: AC

## 2014-08-04 MED ORDER — TRAMADOL HCL 50 MG PO TABS: ORAL_TABLET | ORAL | Status: DC

## 2014-08-04 NOTE — Telephone Encounter (Signed)
s.w. graham and advised on appts

## 2014-08-04 NOTE — Telephone Encounter (Signed)
[facility] 

## 2014-08-06 ENCOUNTER — Encounter: Payer: Self-pay | Admitting: Adult Health

## 2014-08-06 DIAGNOSIS — E785 Hyperlipidemia, unspecified: Secondary | ICD-10-CM | POA: Insufficient documentation

## 2014-08-06 DIAGNOSIS — E039 Hypothyroidism, unspecified: Secondary | ICD-10-CM | POA: Insufficient documentation

## 2014-08-06 DIAGNOSIS — R609 Edema, unspecified: Secondary | ICD-10-CM | POA: Insufficient documentation

## 2014-08-06 DIAGNOSIS — R5381 Other malaise: Secondary | ICD-10-CM | POA: Insufficient documentation

## 2014-08-06 DIAGNOSIS — K5900 Constipation, unspecified: Secondary | ICD-10-CM | POA: Insufficient documentation

## 2014-08-06 DIAGNOSIS — J45909 Unspecified asthma, uncomplicated: Secondary | ICD-10-CM | POA: Insufficient documentation

## 2014-08-06 NOTE — Progress Notes (Signed)
Patient ID: Amber [name], female   DOB: 1925/11/15, 80 y.o.   MRN: [id number]     [facility]  No Known Allergies   Chief Complaint  Patient presents with  . Hospitalization Follow-up    HPI:  She has been hospitalized for a right mastectomy. She had had a right lumpectomy in jan 2014. She is here for short term rehab with her goal to return back home. She does live independently.    Past Medical History  Diagnosis Date  . Arthritis   . Asthma   . GERD (gastroesophageal reflux disease)   . Hyperlipidemia   . Hypertension   . Neuromuscular disorder   . Thyroid disease   . Hearing loss   . Visual disturbance   . Hypothyroidism   . Shortness of breath     occasionally with "excitement"  . Hx of radiation therapy 01/27/13- 03/14/13    right breast axillary/supraclavicular region 45 Gy in 25 fx, upper outer breast cummulative dose of 61 Gy  . Pneumonia     HX OF PNA  . Breast cancer 2013    right  . History of shingles   . Diabetes mellitus without complication     DIET CONTROLLED    Past Surgical History  Procedure Laterality Date  . Back surgery  1980 - approximate  . Carpal tunnel release  unsure of date    both hands  . Rotator cuff repair  unsure of date    not sure which shoulder  . Elbow surgery  unsure of date  . Cervical spine surgery  unsure of date  . Abdominal hysterectomy    . Breast lumpectomy with needle localization and axillary lymph node dissection  11/22/2012    Procedure: BREAST LUMPECTOMY WITH NEDLE LOCALIZATION AND AXILLARY LYMPH NODE DISSECTION;  Surgeon: Madilyn Hook, DO;  Location: [facility];  Service: General;  Laterality: Right;  . Simple mastectomy with axillary sentinel node biopsy Right 08/01/2014    Procedure: RIGHT MASTECTOMY;  Surgeon: Coralie Keens, MD;  Location: [facility];  Service: General;  Laterality: Right;    VITAL SIGNS BP 112/62  Pulse 64  Ht 5\' 2"  (1.575 m)  Wt 193 lb (87.544 kg)  BMI 35.29 kg/m2   Patient's  Medications  New Prescriptions   No medications on file  Previous Medications   ALBUTEROL (PROVENTIL HFA;VENTOLIN HFA) 108 (90 BASE) MCG/ACT INHALER    Inhale 1-2 puffs into the lungs every 4 (four) hours as needed for wheezing or shortness of breath.   AMLODIPINE (NORVASC) 5 MG TABLET    Take 5 mg by mouth every morning.    ASPIRIN 81 MG TABLET    Take 81 mg by mouth daily.   ATORVASTATIN (LIPITOR) 10 MG TABLET    Take 10 mg by mouth daily.   CHOLECALCIFEROL (VITAMIN D) 1000 UNITS TABLET    Take 1,000 Units by mouth daily.   DIAZEPAM (VALIUM) 5 MG TABLET    Take 1/2 tablet by mouth every 8 hours as needed for anxiety   FLUTICASONE-SALMETEROL (ADVAIR) 250-50 MCG/DOSE AEPB    Inhale 1 puff into the lungs 2 (two) times daily.   FUROSEMIDE (LASIX) 80 MG TABLET    Take 80 mg by mouth every morning.    HYDROCODONE-ACETAMINOPHEN (NORCO/VICODIN) 5-325 MG PER TABLET    Take 1 tablet by mouth every 4 (four) hours as needed for moderate pain.   HYDROCODONE-ACETAMINOPHEN (NORCO/VICODIN) 5-325 MG PER TABLET    Take two tablets by mouth every  4 hours as needed for severe pain; Take one tablet by mouth every 4 hours as needed for moderate pain   LETROZOLE (FEMARA) 2.5 MG TABLET    Take 2.5 mg by mouth every morning.   LINACLOTIDE (LINZESS) 145 MCG CAPS CAPSULE    Take 145 mcg by mouth daily.   POTASSIUM CHLORIDE SA (K-DUR,KLOR-CON) 20 MEQ TABLET    Take 40 mEq by mouth 2 (two) times daily.   PROCHLORPERAZINE (COMPAZINE) 5 MG TABLET    Take 5 mg by mouth every 8 (eight) hours as needed for nausea or vomiting.   PROPRANOLOL (INDERAL) 40 MG TABLET    Take 40 mg by mouth every morning.    SYNTHROID 150 MCG TABLET    Take 150 mcg by mouth every morning.    TRAMADOL (ULTRAM) 50 MG TABLET    Take one tablet by mouth every 6 hours as needed for pain  Modified Medications   No medications on file  Discontinued Medications   PRAVASTATIN (PRAVACHOL) 40 MG TABLET    Take 40 mg by mouth every morning.      SIGNIFICANT DIAGNOSTIC EXAMS  None during hospitalization  LABS REVIEWED:   07-31-14: wbc 5.6; hgb 14.2; hct 41.8; mcv 89.7; plt 157; glucose 111; bun 16; creat 0.91; k+3.9; na++142; liver normal albumin 3.6 08-03-14; wbc 4.3; hgb 11.6; hct 35.8; mcv 93; plt 129; glucose 132; bun 11; creat 0.91; k+3.3; na++148      Review of Systems  Constitutional: Negative for malaise/fatigue.  Respiratory: Negative for cough and shortness of breath.   Cardiovascular: Negative for chest pain, palpitations and leg swelling.  Gastrointestinal: Positive for constipation. Negative for heartburn and abdominal pain.       Has chronic constipation; is presently under control   Musculoskeletal: Negative for joint pain and myalgias.       Does have post op pain   Skin: Negative.   Psychiatric/Behavioral: Negative for depression. The patient is not nervous/anxious.      Physical Exam  Constitutional: She is oriented to person, place, and time. She appears well-developed and well-nourished. No distress.  Is obese   Neck: Neck supple. No JVD present.  Cardiovascular: Normal rate, regular rhythm and intact distal pulses.   Respiratory: Effort normal and breath sounds normal. No respiratory distress. She has no wheezes.  GI: Soft. Bowel sounds are normal. She exhibits no distension. There is no tenderness.  Musculoskeletal: She exhibits edema.  Is able to move all extremities Has trace lower extremity edema   Neurological: She is alert and oriented to person, place, and time.  Skin: Skin is warm and dry. She is not diaphoretic.  Incision line without signs of infection present JP drain with serosanguinous drainage       ASSESSMENT/ PLAN:  1. Right breast cancer: is status post right mastectomy. Will follow up with oncology and surgeon as indicated. Will continue femara 2.5 mg daily will continue vicodin 5/325 mg 1 or 2 tabs every 4 hours as needed; and ultram 50 mg every 6 hours as needed and will  monitor her status.   2. Chronic asthma: is presently stable will continue advair 250/50 twice daily and albuterol 1 puff every 4 hours as needed and will monitor her status  3. Hypertension: is stable will continue norvasc 5 mg daily; inderal 4 mg daily  asa 81 mg daily   4. Dyslipidemia: will continue lipitor 10 mg daily  5. Hypothyroidism: will continue synthroid 150 mcg daily   6. Edema:  is stable will continue lasix 80 mg daily with k+ 40 meq twice daily   7. Constipation: she is stable with linzess 145 mcg daily. She states she has tried other medications without relief of her symptoms.   8. Physical deconditioning: will continue therapy as directed to improve upon her gait; balance; strength; and independence with adls   Time spent with patient 50 minutes  Ok Edwards NP [facility]  Contact [phone] Monday through Friday 8am- 5pm  After hours call [phone]

## 2014-08-09 ENCOUNTER — Non-Acute Institutional Stay (SKILLED_NURSING_FACILITY): Payer: Medicare PPO | Admitting: Adult Health

## 2014-08-09 DIAGNOSIS — C50419 Malignant neoplasm of upper-outer quadrant of unspecified female breast: Secondary | ICD-10-CM

## 2014-08-09 DIAGNOSIS — J45909 Unspecified asthma, uncomplicated: Secondary | ICD-10-CM

## 2014-08-09 DIAGNOSIS — I10 Essential (primary) hypertension: Secondary | ICD-10-CM

## 2014-08-09 DIAGNOSIS — E039 Hypothyroidism, unspecified: Secondary | ICD-10-CM

## 2014-08-10 ENCOUNTER — Ambulatory Visit (HOSPITAL_BASED_OUTPATIENT_CLINIC_OR_DEPARTMENT_OTHER): Payer: Medicare PPO | Admitting: Hematology and Oncology

## 2014-08-10 ENCOUNTER — Non-Acute Institutional Stay (SKILLED_NURSING_FACILITY): Payer: Medicare PPO | Admitting: Internal Medicine

## 2014-08-10 ENCOUNTER — Telehealth: Payer: Self-pay | Admitting: Hematology and Oncology

## 2014-08-10 VITALS — BP 128/68 | HR 66 | Temp 98.7°F | Resp 18 | Ht 62.0 in | Wt 191.7 lb

## 2014-08-10 DIAGNOSIS — E876 Hypokalemia: Secondary | ICD-10-CM

## 2014-08-10 DIAGNOSIS — C50411 Malignant neoplasm of upper-outer quadrant of right female breast: Secondary | ICD-10-CM

## 2014-08-10 DIAGNOSIS — K5900 Constipation, unspecified: Secondary | ICD-10-CM

## 2014-08-10 DIAGNOSIS — R5381 Other malaise: Secondary | ICD-10-CM

## 2014-08-10 DIAGNOSIS — Z170 Estrogen receptor positive status [ER+]: Secondary | ICD-10-CM

## 2014-08-10 DIAGNOSIS — I10 Essential (primary) hypertension: Secondary | ICD-10-CM

## 2014-08-10 DIAGNOSIS — C50911 Malignant neoplasm of unspecified site of right female breast: Secondary | ICD-10-CM

## 2014-08-10 DIAGNOSIS — R609 Edema, unspecified: Secondary | ICD-10-CM

## 2014-08-10 DIAGNOSIS — J45909 Unspecified asthma, uncomplicated: Secondary | ICD-10-CM

## 2014-08-10 DIAGNOSIS — C50419 Malignant neoplasm of upper-outer quadrant of unspecified female breast: Secondary | ICD-10-CM

## 2014-08-10 DIAGNOSIS — E039 Hypothyroidism, unspecified: Secondary | ICD-10-CM

## 2014-08-10 NOTE — Progress Notes (Signed)
Patient Care Team: [name], MD as PCP - General  DIAGNOSIS: Breast cancer Cancer of upper-outer quadrant of female breast   Primary site: Breast (Right)   Staging method: AJCC 7th Edition   Pathologic: Stage IIIA (T2, N2a, cM0) signed by Rulon Eisenmenger, MD on 07/04/2014  9:14 AM   Summary: Stage IIIA (T2, N2a, cM0)   SUMMARY OF ONCOLOGIC HISTORY:   Cancer of upper-outer quadrant of female breast   10/19/2012 Initial Diagnosis Cancer of upper-outer quadrant of female breast; IDC ER 7% PR 0% Ki-67 19% HER-2 positive ratio 2.83; lymph node biopsy positive for cancer   11/09/2012 Breast MRI 2.7 x 2.4 x 2.2 cm biopsy-proven invasive ductal carcinoma deep in the upper outer quadrant of the right breast.1.8 cm biopsy-proven metastatic level I right axillary lymph node.    11/23/2012 Surgery Right breast lumpectomy 2.7 cm moderately differentiated mammary cancer next ductal and lobular features associated LCIS, LVIS present margins negative   01/01/2013 - 02/22/2014 Radiation Therapy Radiation therapy to lumpectomy site   04/01/2013 -  Anti-estrogen oral therapy Letrozole 2.5 mg started 04/01/2013 plan is for 5 years of therapy   06/30/2014 Relapse/Recurrence Right breast mass biopsy invasive mammary cancer possibly lobular type ER 58% PR 0% Ki-67 18%   08/01/2014 Surgery Invasive mammary cancer with mammary carcinoma in situ margins negative lymphovascular invasion present dermal nipple involvement by tumor 8.5 cm ER 58% PR 0% HER-2 amplified ratio 3.16 Ki-67 80%    Anti-estrogen oral therapy Continue with letrozole 2.5 mg daily patient was offered Herceptin but she refused    CHIEF COMPLIANT: Followup of the mastectomy  INTERVAL HISTORY: Amber [name] is a 80 year old African American lady with above-mentioned history of right breast cancer treated with lumpectomy in January 2014 followed by radiation and antiestrogen therapy that started May 2014 with letrozole. When I examined her in 06/30/2014  there was a very large tumors in the breast and she was referred for surgery. She underwent right breast mastectomy and final pathology revealed a 0.5 cm tumor that was ER positive PR negative HER-2 positive. She is here after surgery to discuss adjuvant therapy options. She is recovering very well from surgery is currently in rehabilitation. She plans to the rehabilitation in about 7-10 days.  REVIEW OF SYSTEMS:   Constitutional: Frail-appearing lady in no acute distress Eyes: Denies blurriness of vision Ears, nose, mouth, throat, and face: Denies mucositis or sore throat Respiratory: Denies cough, dyspnea or wheezes Cardiovascular: Denies palpitation, chest discomfort or lower extremity swelling Gastrointestinal:  Denies nausea, heartburn or change in bowel habits Skin: Denies abnormal skin rashes Lymphatics: Denies new lymphadenopathy or easy bruising Neurological:Denies numbness, tingling or new weaknesses Behavioral/Psych: Mood is stable, no new changes  Breast: Soreness in the breast from recent surgery All other systems were reviewed with the patient and are negative.  I have reviewed the past medical history, past surgical history, social history and family history with the patient and they are unchanged from previous note.  ALLERGIES:  has No Known Allergies.  MEDICATIONS:  Current Outpatient Prescriptions  Medication Sig Dispense Refill  . albuterol (PROVENTIL HFA;VENTOLIN HFA) 108 (90 BASE) MCG/ACT inhaler Inhale 1-2 puffs into the lungs every 4 (four) hours as needed for wheezing or shortness of breath.      Marland Kitchen amLODipine (NORVASC) 5 MG tablet Take 5 mg by mouth every morning.       Marland Kitchen aspirin 81 MG tablet Take 81 mg by mouth daily.      Marland Kitchen  atorvastatin (LIPITOR) 10 MG tablet Take 10 mg by mouth daily.      . cholecalciferol (VITAMIN D) 1000 UNITS tablet Take 1,000 Units by mouth daily.      . diazepam (VALIUM) 5 MG tablet Take 1/2 tablet by mouth every 8 hours as needed for  anxiety  45 tablet  5  . Fluticasone-Salmeterol (ADVAIR) 250-50 MCG/DOSE AEPB Inhale 1 puff into the lungs 2 (two) times daily.      . furosemide (LASIX) 80 MG tablet Take 80 mg by mouth every morning.       Marland Kitchen HYDROcodone-acetaminophen (NORCO/VICODIN) 5-325 MG per tablet Take 1 tablet by mouth every 4 (four) hours as needed for moderate pain.      Marland Kitchen HYDROcodone-acetaminophen (NORCO/VICODIN) 5-325 MG per tablet Take two tablets by mouth every 4 hours as needed for severe pain; Take one tablet by mouth every 4 hours as needed for moderate pain  360 tablet  0  . letrozole (FEMARA) 2.5 MG tablet Take 2.5 mg by mouth every morning.      . Linaclotide (LINZESS) 145 MCG CAPS capsule Take 145 mcg by mouth daily.      . potassium chloride SA (K-DUR,KLOR-CON) 20 MEQ tablet Take 40 mEq by mouth 2 (two) times daily.      . prochlorperazine (COMPAZINE) 5 MG tablet Take 5 mg by mouth every 8 (eight) hours as needed for nausea or vomiting.      . propranolol (INDERAL) 40 MG tablet Take 40 mg by mouth every morning.       Marland Kitchen SYNTHROID 150 MCG tablet Take 150 mcg by mouth every morning.       . traMADol (ULTRAM) 50 MG tablet Take one tablet by mouth every 6 hours as needed for pain  120 tablet  5   No current facility-administered medications for this visit.    PHYSICAL EXAMINATION: ECOG PERFORMANCE STATUS: 2 - Symptomatic, <50% confined to bed  Filed Vitals:   08/10/14 1245  BP: 128/68  Pulse: 66  Temp: 98.7 F (37.1 C)  Resp: 18   Filed Weights   08/10/14 1245  Weight: 191 lb 11.2 oz (86.955 kg)    GENERAL:alert, no distress and comfortable SKIN: skin color, texture, turgor are normal, no rashes or significant lesions EYES: normal, Conjunctiva are pink and non-injected, sclera clear OROPHARYNX:no exudate, no erythema and lips, buccal mucosa, and tongue normal  NECK: supple, thyroid normal size, non-tender, without nodularity LYMPH:  no palpable lymphadenopathy in the cervical, axillary or  inguinal LUNGS: clear to auscultation and percussion with normal breathing effort HEART: regular rate & rhythm and no murmurs and no lower extremity edema ABDOMEN:abdomen soft, non-tender and normal bowel sounds Musculoskeletal:no cyanosis of digits and no clubbing  NEURO: alert & oriented x 3 with fluent speech, no focal motor/sensory deficits   LABORATORY DATA:  I have reviewed the data as listed   Chemistry      Component Value Date/Time   NA 148* 08/03/2014 0415   NA 147* 07/04/2014 1001   K 3.3* 08/03/2014 0415   K 3.0* 07/04/2014 1001   CL 108 08/03/2014 0415   CL 103 04/01/2013 1039   CO2 30 08/03/2014 0415   CO2 30* 07/04/2014 1001   BUN 11 08/03/2014 0415   BUN 10.8 07/04/2014 1001   CREATININE 0.91 08/03/2014 0415   CREATININE 1.0 07/04/2014 1001      Component Value Date/Time   CALCIUM 8.9 08/03/2014 0415   CALCIUM 9.7 07/04/2014 1001   ALKPHOS  129* 07/31/2014 1500   ALKPHOS 107 07/04/2014 1001   AST 19 07/31/2014 1500   AST 15 07/04/2014 1001   ALT 11 07/31/2014 1500   ALT 11 07/04/2014 1001   BILITOT 0.5 07/31/2014 1500   BILITOT 0.48 07/04/2014 1001       Lab Results  Component Value Date   WBC 4.3 08/03/2014   HGB 11.6* 08/03/2014   HCT 35.8* 08/03/2014   MCV 93.0 08/03/2014   PLT 129* 08/03/2014   NEUTROABS 2.3 07/04/2014     RADIOGRAPHIC STUDIES: I have personally reviewed the radiology reports and agreed with their findings. No results found.   ASSESSMENT & PLAN:  Cancer of upper-outer quadrant of female breast Relapsed right breast cancer status post mastectomy: ER/PR positive PR negative HER-2 amplified and I discussed with the patient the pathology report in great detail.  I offered her systemic therapy with Herceptin plus letrozole. But patient did not want to go on any IV therapy. I discussed the risks and benefits of Herceptin including cardiac risks. She wanted to stay on letrozole alone and watch and monitor her cancer. Given her advanced age and other health issues,  this is not unreasonable.  I will see her back in 3 months or followup with physical exam and evaluation.    Orders Placed This Encounter  Procedures  . CBC with Differential    Standing Status: Future     Number of Occurrences:      Standing Expiration Date: 08/10/2015  . Comprehensive metabolic panel (Cmet) - [facility]    Standing Status: Future     Number of Occurrences:      Standing Expiration Date: 08/10/2015   The patient has a good understanding of the overall plan. she agrees with it. She will call with any problems that may develop before her next visit here.  I spent 25 minutes counseling the patient face to face. The total time spent in the appointment was 30 minutes and more than 50% was on counseling and review of test results    Rulon Eisenmenger, MD 08/10/2014 2:50 PM

## 2014-08-10 NOTE — Telephone Encounter (Signed)
per pof to sch appt-gave pt copy of sch °

## 2014-08-10 NOTE — Assessment & Plan Note (Signed)
Relapsed right breast cancer status post mastectomy: ER/PR positive PR negative HER-2 amplified and I discussed with the patient the pathology report in great detail.  I offered her systemic therapy with Herceptin plus letrozole. But patient did not want to go on any IV therapy. I discussed the risks and benefits of Herceptin including cardiac risks. She wanted to stay on letrozole alone and watch and monitor her cancer. Given her advanced age and other health issues, this is not unreasonable.  I will see her back in 3 months or followup with physical exam and evaluation.

## 2014-08-10 NOTE — Progress Notes (Signed)
Patient ID: Amber [name], female   DOB: 11/21/1925, 80 y.o.   MRN: [id number]     Facility: [facility]    PCP: KOHUT,WALTER DENNIS, MD  No Known Allergies  Chief Complaint: new admission  HPI:  80 y/o female patient is here for STR after hospital admission from 08/01/14-08/03/14 for right mastectomy. She has hx of breast cancer s/p lumpectomy in jan 2014 followed by radiation and antiestrogen therapy in May 2014. She underwent right breast mastectomy and final pathology revealed a 0.5 cm tumor that was ER positive PR negative HER-2 positive.  She is seen in her room with her 2 daughters present. She has generalized weakness. She denies any other concerns. She was seen by oncology today and pt has decided to be on letrozole. Notes reviewed.  Review of Systems:  Constitutional: Negative for fever, chills, malaise/fatigue and diaphoresis.  HENT: Negative for congestion  Eyes: Negative for eye pain, blurred vision, double vision and discharge.  Respiratory: Negative for cough, sputum production, shortness of breath and wheezing.   Cardiovascular: Negative for chest pain, palpitations, orthopnea and leg swelling.  Gastrointestinal: Negative for heartburn, nausea, vomiting, abdominal pain, diarrhea and constipation.  Genitourinary: Negative for dysuria.  Musculoskeletal: Negative for back pain, falls, joint pain and myalgias.  Skin: Negative for itching and rash.  Neurological: Negative for dizziness, tingling, focal weakness and headaches.  Psychiatric/Behavioral: Negative for depression.     Past Medical History  Diagnosis Date  . Arthritis   . Asthma   . GERD (gastroesophageal reflux disease)   . Hyperlipidemia   . Hypertension   . Neuromuscular disorder   . Thyroid disease   . Hearing loss   . Visual disturbance   . Hypothyroidism   . Shortness of breath     occasionally with "excitement"  . Hx of radiation therapy 01/27/13- 03/14/13    right  breast axillary/supraclavicular region 45 Gy in 25 fx, upper outer breast cummulative dose of 61 Gy  . Pneumonia     HX OF PNA  . Breast cancer 2013    right  . History of shingles   . Diabetes mellitus without complication     DIET CONTROLLED   Past Surgical History  Procedure Laterality Date  . Back surgery  1980 - approximate  . Carpal tunnel release  unsure of date    both hands  . Rotator cuff repair  unsure of date    not sure which shoulder  . Elbow surgery  unsure of date  . Cervical spine surgery  unsure of date  . Abdominal hysterectomy    . Breast lumpectomy with needle localization and axillary lymph node dissection  11/22/2012    Procedure: BREAST LUMPECTOMY WITH NEDLE LOCALIZATION AND AXILLARY LYMPH NODE DISSECTION;  Surgeon: Brian Layton, DO;  Location: [facility];  Service: General;  Laterality: Right;  . Simple mastectomy with axillary sentinel node biopsy Right 08/01/2014    Procedure: RIGHT MASTECTOMY;  Surgeon: Douglas Blackman, MD;  Location: [facility];  Service: General;  Laterality: Right;   Social History:   reports that she has never smoked. She has never used smokeless tobacco. She reports that she does not drink alcohol or use illicit drugs.  Family History  Problem Relation Age of Onset  . Cancer Mother     uterine  . Cancer Brother     oral, throat    Medications: Patient's Medications  New Prescriptions   No medications on file  Previous Medications     ALBUTEROL (PROVENTIL HFA;VENTOLIN HFA) 108 (90 BASE) MCG/ACT INHALER    Inhale 1-2 puffs into the lungs every 4 (four) hours as needed for wheezing or shortness of breath.   AMLODIPINE (NORVASC) 5 MG TABLET    Take 5 mg by mouth every morning.    ASPIRIN 81 MG TABLET    Take 81 mg by mouth daily.   ATORVASTATIN (LIPITOR) 10 MG TABLET    Take 10 mg by mouth daily.   CHOLECALCIFEROL (VITAMIN D) 1000 UNITS TABLET    Take 1,000 Units by mouth daily.   DIAZEPAM (VALIUM) 5 MG TABLET    Take 1/2 tablet by mouth  every 8 hours as needed for anxiety   FLUTICASONE-SALMETEROL (ADVAIR) 250-50 MCG/DOSE AEPB    Inhale 1 puff into the lungs 2 (two) times daily.   FUROSEMIDE (LASIX) 80 MG TABLET    Take 80 mg by mouth every morning.    HYDROCODONE-ACETAMINOPHEN (NORCO/VICODIN) 5-325 MG PER TABLET    Take 1 tablet by mouth every 4 (four) hours as needed for moderate pain.   HYDROCODONE-ACETAMINOPHEN (NORCO/VICODIN) 5-325 MG PER TABLET    Take two tablets by mouth every 4 hours as needed for severe pain; Take one tablet by mouth every 4 hours as needed for moderate pain   LETROZOLE (FEMARA) 2.5 MG TABLET    Take 2.5 mg by mouth every morning.   LINACLOTIDE (LINZESS) 145 MCG CAPS CAPSULE    Take 145 mcg by mouth daily.   POTASSIUM CHLORIDE SA (K-DUR,KLOR-CON) 20 MEQ TABLET    Take 40 mEq by mouth 2 (two) times daily.   PROCHLORPERAZINE (COMPAZINE) 5 MG TABLET    Take 5 mg by mouth every 8 (eight) hours as needed for nausea or vomiting.   PROPRANOLOL (INDERAL) 40 MG TABLET    Take 40 mg by mouth every morning.    SYNTHROID 150 MCG TABLET    Take 150 mcg by mouth every morning.    TRAMADOL (ULTRAM) 50 MG TABLET    Take one tablet by mouth every 6 hours as needed for pain  Modified Medications   No medications on file  Discontinued Medications   No medications on file     Physical Exam:  Filed Vitals:   08/10/14 1447  BP: 120/58  Pulse: 68  Temp: 96.7 F (35.9 C)  Resp: 18  SpO2: 96%    General- elderly female in no acute distress, obese Head- atraumatic, normocephalic Eyes- PERRLA, EOMI, no pallor, no icterus, no discharge Neck- no cervical lymphadenopathy Throat- moist mucus membrane Breast- right mastectomy, has right chest JP drain, site clean with dressing in place, incision site clean  Cardiovascular- normal s1,s2, no murmurs Respiratory- bilateral clear to auscultation, no wheeze, no rhonchi, no crackles, no use of accessory muscles Abdomen- bowel sounds present, soft, non  tender Musculoskeletal- able to move all 4 extremities, trace leg edema Neurological- no focal deficit Skin- warm and dry Psychiatry- alert and oriented to person, place and time, normal mood and affect    Labs reviewed: Basic Metabolic Panel:  Recent Labs  11/02/13 0540  07/31/14 1500 08/02/14 0428 08/03/14 0415  NA 144  < > 142 143 148*  K 4.0  < > 3.9 3.5* 3.3*  CL 107  < > 99 104 108  CO2 28  < > 31 30 30  GLUCOSE 116*  < > 111* 120* 132*  BUN 11  < > 16 11 11  CREATININE 0.87  < > 0.91 1.02 0.91  CALCIUM   8.5  < > 9.7 9.0 8.9  MG 1.9  --   --   --   --   < > = values in this interval not displayed. Liver Function Tests:  Recent Labs  04/04/14 1020 07/04/14 1001 07/31/14 1500  AST _0 ALT _1 ALKPHOS 102 107 129*  BILITOT 0.59 0.48 0.5  PROT 6.8 6.6 7.2  ALBUMIN 3.4* 3.3* 3.6   No results found for this basename: LIPASE, AMYLASE,  in the last 8760 hours No results found for this basename: AMMONIA,  in the last 8760 hours CBC:  Recent Labs  01/02/14 0935 04/04/14 1020 07/04/14 1001 07/31/14 1500 08/02/14 0428 08/03/14 0415  WBC 4.2 3.6* 3.7* 5.6 4.1 4.3  NEUTROABS 2.9 2.0 2.3  --   --   --   HGB 14.2 14.6 13.5 14.2 12.0 11.6*  HCT 42.7 43.7 40.4 41.8 36.1 35.8*  MCV 94.0 91.7 92.0 89.7 92.1 93.0  PLT 170 182 140* 157 142* 129*   CBG:  Recent Labs  11/05/13 1114 08/01/14 0751 08/01/14 1113  GLUCAP 146* 115* 116*    Assessment/Plan  ASSESSMENT/ PLAN:  Physical deconditioning Will have patient work with PT/OT as tolerated to regain strength and restore function.  Fall precautions are in place.  Breast cancer S/p mastectomy, continue letrozole. Continue skin care and drain care. F/u with oncology. Continue prn pain regimen.   HTN Stable. Continue inderal and norvasc, monitor clinically  Hypokalemia Continue kcl supplement, monitor bmp  Constipation Continue linzess  Edema: continue lasix 80 mg daily, kcl  supplement  Chronic asthma continue advair 250/50 twice daily and albuterol 1 puff every 4 hours as needed and will monitor her status  Hypothyroidism continue synthroid 150 mcg daily    Family/ staff Communication: reviewed care plan with patient and nursing supervisor   Goals of care: short term rehabilitation    Labs/tests ordered: cbc, cmp    Blanchie Serve, MD  [facility] [phone] (Monday-Friday 8 am - 5 pm) [phone] (afterhours)

## 2014-08-11 ENCOUNTER — Telehealth: Payer: Self-pay | Admitting: Hematology and Oncology

## 2014-08-11 NOTE — Telephone Encounter (Signed)
returned call to [place] at [facility] - [phone] ext [phone] re 3 mo f/u. confirmed 11/14/14 appts. pt was given appts prior to leaving yesterday.

## 2014-08-28 NOTE — Progress Notes (Signed)
Patient ID: Amber [name], female   DOB: [birth year], 80 y.o.   MRN: [id number]     [facility]  No Known Allergies   Chief Complaint  Patient presents with  . Discharge Note    HPI:  She is being discharged to home with home health for pt/ot/rn/aid. She will need a front wheel walker. She will need a follow up with her pcp and her prescriptions to be written. She had been hospitalized for a right breast mastectomy.   Past Medical History  Diagnosis Date  . Arthritis   . Asthma   . GERD (gastroesophageal reflux disease)   . Hyperlipidemia   . Hypertension   . Neuromuscular disorder   . Thyroid disease   . Hearing loss   . Visual disturbance   . Hypothyroidism   . Shortness of breath     occasionally with "excitement"  . Hx of radiation therapy 01/27/13- 03/14/13    right breast axillary/supraclavicular region 45 Gy in 25 fx, upper outer breast cummulative dose of 61 Gy  . Pneumonia     HX OF PNA  . Breast cancer 2013    right  . History of shingles   . Diabetes mellitus without complication     DIET CONTROLLED    Past Surgical History  Procedure Laterality Date  . Back surgery  1980 - approximate  . Carpal tunnel release  unsure of date    both hands  . Rotator cuff repair  unsure of date    not sure which shoulder  . Elbow surgery  unsure of date  . Cervical spine surgery  unsure of date  . Abdominal hysterectomy    . Breast lumpectomy with needle localization and axillary lymph node dissection  11/22/2012    Procedure: BREAST LUMPECTOMY WITH NEDLE LOCALIZATION AND AXILLARY LYMPH NODE DISSECTION;  Surgeon: Madilyn Hook, DO;  Location: [facility];  Service: General;  Laterality: Right;  . Simple mastectomy with axillary sentinel node biopsy Right 08/01/2014    Procedure: RIGHT MASTECTOMY;  Surgeon: Coralie Keens, MD;  Location: [facility];  Service: General;  Laterality: Right;    VITAL SIGNS BP 128/76  Pulse 68  Ht 5\' 2"  (1.575 m)  Wt 179 lb (81.194 kg)  BMI  32.73 kg/m2   Patient's Medications  New Prescriptions   No medications on file  Previous Medications   ALBUTEROL (PROVENTIL HFA;VENTOLIN HFA) 108 (90 BASE) MCG/ACT INHALER    Inhale 1-2 puffs into the lungs every 4 (four) hours as needed for wheezing or shortness of breath.   AMLODIPINE (NORVASC) 5 MG TABLET    Take 5 mg by mouth every morning.    ASPIRIN 81 MG TABLET    Take 81 mg by mouth daily.   ATORVASTATIN (LIPITOR) 10 MG TABLET    Take 10 mg by mouth daily.   CHOLECALCIFEROL (VITAMIN D) 1000 UNITS TABLET    Take 1,000 Units by mouth daily.   DIAZEPAM (VALIUM) 5 MG TABLET    Take 1/2 tablet by mouth every 8 hours as needed for anxiety   FLUTICASONE-SALMETEROL (ADVAIR) 250-50 MCG/DOSE AEPB    Inhale 1 puff into the lungs 2 (two) times daily.   FUROSEMIDE (LASIX) 80 MG TABLET    Take 80 mg by mouth every morning.    HYDROCODONE-ACETAMINOPHEN (NORCO/VICODIN) 5-325 MG PER TABLET    Take 1 tablet by mouth every 4 (four) hours as needed for moderate pain.   HYDROCODONE-ACETAMINOPHEN (NORCO/VICODIN) 5-325 MG PER TABLET  Take two tablets by mouth every 4 hours as needed for severe pain; Take one tablet by mouth every 4 hours as needed for moderate pain   LETROZOLE (FEMARA) 2.5 MG TABLET    Take 2.5 mg by mouth every morning.   LINACLOTIDE (LINZESS) 145 MCG CAPS CAPSULE    Take 145 mcg by mouth daily.   POTASSIUM CHLORIDE SA (K-DUR,KLOR-CON) 20 MEQ TABLET    Take 40 mEq by mouth 2 (two) times daily.   PROCHLORPERAZINE (COMPAZINE) 5 MG TABLET    Take 5 mg by mouth every 8 (eight) hours as needed for nausea or vomiting.   PROPRANOLOL (INDERAL) 40 MG TABLET    Take 40 mg by mouth every morning.    SYNTHROID 150 MCG TABLET    Take 150 mcg by mouth every morning.    TRAMADOL (ULTRAM) 50 MG TABLET    Take one tablet by mouth every 6 hours as needed for pain  Modified Medications   No medications on file  Discontinued Medications   No medications on file    SIGNIFICANT DIAGNOSTIC  EXAMS   None during hospitalization  LABS REVIEWED:   07-31-14: wbc 5.6; hgb 14.2; hct 41.8; mcv 89.7; plt 157; glucose 111; bun 16; creat 0.91; k+3.9; na++142; liver normal albumin 3.6 08-03-14; wbc 4.3; hgb 11.6; hct 35.8; mcv 93; plt 129; glucose 132; bun 11; creat 0.91; k+3.3; na++148      Review of Systems  Constitutional: Negative for malaise/fatigue.  Respiratory: Negative for cough and shortness of breath.   Cardiovascular: Negative for chest pain, palpitations and leg swelling.  Gastrointestinal: Positive for constipation. Negative for heartburn and abdominal pain.       Has chronic constipation; is presently under control   Musculoskeletal: Negative for joint pain and myalgias.       Does have post op pain   Skin: Negative.   Psychiatric/Behavioral: Negative for depression. The patient is not nervous/anxious.      Physical Exam  Constitutional: She is oriented to person, place, and time. She appears well-developed and well-nourished. No distress.  Is obese   Neck: Neck supple. No JVD present.  Cardiovascular: Normal rate, regular rhythm and intact distal pulses.   Respiratory: Effort normal and breath sounds normal. No respiratory distress. She has no wheezes.  GI: Soft. Bowel sounds are normal. She exhibits no distension. There is no tenderness.  Musculoskeletal: She exhibits edema.  Is able to move all extremities Has trace lower extremity edema   Neurological: She is alert and oriented to person, place, and time.  Skin: Skin is warm and dry. She is not diaphoretic.  Incision line without signs of infection present JP drain with serosanguinous drainage       ASSESSMENT/ PLAN:   Will discharge to home with home health for pt/ot to improve upon strength gait independence RN/aid for disease medication management drain management and adl care. She will need a front wheel walker in order to maintain her current level of independence with her adl's. Her prescriptions  have been written for a 30 day supply of medications with #30 vicodin 5/325 mg tabs and #30 ultram 5 m mg tabs. She has a follow up appointment with her pcp [name] on 08-30-14 at 2:30pm.     Time spent with patient 40 minutes  Ok Edwards NP [facility]  Contact [phone] Monday through Friday 8am- 5pm  After hours call [phone]

## 2014-08-29 ENCOUNTER — Telehealth: Payer: Self-pay | Admitting: *Deleted

## 2014-08-29 NOTE — Telephone Encounter (Signed)
Called pt to check in and see if she had needs or concerns. Pt relate her only concern is that it is taking longer for the "soreness" to go away with the last surgery she had. Discussed with pt that she had a larger more intensive surgery with the mastectomy vs lumpectomy. Pt relate she is doing well and has no questions at this time. Encourage pt to call with needs. Received verbal understanding. Contact information given.

## 2014-09-14 ENCOUNTER — Other Ambulatory Visit: Payer: Self-pay | Admitting: Adult Health

## 2014-10-04 ENCOUNTER — Other Ambulatory Visit: Payer: Self-pay | Admitting: Adult Health

## 2014-10-22 ENCOUNTER — Other Ambulatory Visit: Payer: Self-pay | Admitting: Adult Health

## 2014-10-23 ENCOUNTER — Other Ambulatory Visit: Payer: Self-pay | Admitting: Adult Health

## 2014-11-14 ENCOUNTER — Other Ambulatory Visit (HOSPITAL_BASED_OUTPATIENT_CLINIC_OR_DEPARTMENT_OTHER): Payer: Medicare PPO

## 2014-11-14 ENCOUNTER — Ambulatory Visit (HOSPITAL_BASED_OUTPATIENT_CLINIC_OR_DEPARTMENT_OTHER): Payer: Medicare PPO | Admitting: Hematology and Oncology

## 2014-11-14 ENCOUNTER — Telehealth: Payer: Self-pay | Admitting: Hematology and Oncology

## 2014-11-14 VITALS — BP 119/59 | HR 83 | Temp 98.3°F | Resp 18 | Ht 62.0 in | Wt 179.8 lb

## 2014-11-14 DIAGNOSIS — C773 Secondary and unspecified malignant neoplasm of axilla and upper limb lymph nodes: Secondary | ICD-10-CM

## 2014-11-14 DIAGNOSIS — C50419 Malignant neoplasm of upper-outer quadrant of unspecified female breast: Secondary | ICD-10-CM

## 2014-11-14 DIAGNOSIS — M791 Myalgia: Secondary | ICD-10-CM

## 2014-11-14 DIAGNOSIS — R112 Nausea with vomiting, unspecified: Secondary | ICD-10-CM

## 2014-11-14 DIAGNOSIS — C50411 Malignant neoplasm of upper-outer quadrant of right female breast: Secondary | ICD-10-CM

## 2014-11-14 LAB — COMPREHENSIVE METABOLIC PANEL (CC13)
ALBUMIN: 3.3 g/dL — AB (ref 3.5–5.0)
ALT: 9 U/L (ref 0–55)
AST: 16 U/L (ref 5–34)
Alkaline Phosphatase: 163 U/L — ABNORMAL HIGH (ref 40–150)
Anion Gap: 10 mEq/L (ref 3–11)
BUN: 14.7 mg/dL (ref 7.0–26.0)
CALCIUM: 9.7 mg/dL (ref 8.4–10.4)
CHLORIDE: 105 meq/L (ref 98–109)
CO2: 29 mEq/L (ref 22–29)
Creatinine: 0.9 mg/dL (ref 0.6–1.1)
EGFR: 69 mL/min/{1.73_m2} — ABNORMAL LOW (ref >90)
GLUCOSE: 119 mg/dL (ref 70–140)
POTASSIUM: 3.8 meq/L (ref 3.5–5.1)
SODIUM: 144 meq/L (ref 136–145)
TOTAL PROTEIN: 7 g/dL (ref 6.4–8.3)
Total Bilirubin: 0.46 mg/dL (ref 0.20–1.20)

## 2014-11-14 LAB — CBC WITH DIFFERENTIAL/PLATELET
BASO%: 0.7 % (ref 0.0–2.0)
BASOS ABS: 0 10*3/uL (ref 0.0–0.1)
EOS%: 1.7 % (ref 0.0–7.0)
Eosinophils Absolute: 0.1 10*3/uL (ref 0.0–0.5)
HEMATOCRIT: 42.4 % (ref 34.8–46.6)
HGB: 13.6 g/dL (ref 11.6–15.9)
LYMPH%: 30.9 % (ref 14.0–49.7)
MCH: 29.9 pg (ref 25.1–34.0)
MCHC: 32.2 g/dL (ref 31.5–36.0)
MCV: 92.9 fL (ref 79.5–101.0)
MONO#: 0.3 10*3/uL (ref 0.1–0.9)
MONO%: 6.8 % (ref 0.0–14.0)
NEUT#: 2.7 10*3/uL (ref 1.5–6.5)
NEUT%: 59.9 % (ref 38.4–76.8)
Platelets: 203 10*3/uL (ref 145–400)
RBC: 4.57 10*6/uL (ref 3.70–5.45)
RDW: 14.5 % (ref 11.2–14.5)
WBC: 4.6 10*3/uL (ref 3.9–10.3)
lymph#: 1.4 10*3/uL (ref 0.9–3.3)

## 2014-11-14 MED ORDER — TAMOXIFEN CITRATE 20 MG PO TABS: 20.0000 mg | ORAL_TABLET | Freq: Every day | ORAL | Status: DC

## 2014-11-14 NOTE — Assessment & Plan Note (Addendum)
Relapsed right breast cancer status post mastectomy: ER/PR positive PR negative HER-2 amplified ( August 2015)  Status post resection 08/01/2014 , patient refused adjuvant Herceptin and is currently on letrozole 2.5 mg daily  Letrozole toxicities: 1. Diffuse musculoskeletal pain 9/10 2. Intermittent nausea and vomiting After much discussion, we elected to switch her from omeprazole to tamoxifen 20 mg daily. She will take this for 3 months and follow-up with [place] to discuss side effects.  Breast cancer surveillance: 1. Mammogram to be done September 2016 Return to clinic in 3 months for follow-up

## 2014-11-14 NOTE — Progress Notes (Signed)
Patient Care Team: [name], MD as PCP - General Rulon Eisenmenger, MD as Consulting Physician (Hematology and Oncology) Blair Promise, MD as Consulting Physician (Radiation Oncology) Coralie Keens, MD as Consulting Physician (General Surgery)  DIAGNOSIS: Primary cancer of upper outer quadrant of right female breast   Staging form: Breast, AJCC 7th Edition     Clinical: No stage assigned - Unsigned     Pathologic: Stage IIIA (T2, N2a, cM0) - Signed by Rulon Eisenmenger, MD on 07/04/2014   SUMMARY OF ONCOLOGIC HISTORY:   Primary cancer of upper outer quadrant of right female breast   10/19/2012 Initial Diagnosis Cancer of upper-outer quadrant of female breast; IDC ER 7% PR 0% Ki-67 19% HER-2 positive ratio 2.83; lymph node biopsy positive for cancer   11/09/2012 Breast MRI 2.7 x 2.4 x 2.2 cm biopsy-proven invasive ductal carcinoma deep in the upper outer quadrant of the right breast.1.8 cm biopsy-proven metastatic level I right axillary lymph node.    11/23/2012 Surgery Right breast lumpectomy 2.7 cm moderately differentiated mammary cancer next ductal and lobular features associated LCIS, LVIS present margins negative   01/01/2013 - 02/22/2014 Radiation Therapy Radiation therapy to lumpectomy site   04/01/2013 -  Anti-estrogen oral therapy Letrozole 2.5 mg started 04/01/2013 plan is for 5 years of therapy   06/30/2014 Relapse/Recurrence Right breast mass biopsy invasive mammary cancer possibly lobular type ER 58% PR 0% Ki-67 18%   08/01/2014 Surgery Invasive mammary cancer with mammary carcinoma in situ margins negative lymphovascular invasion present dermal nipple involvement by tumor 8.5 cm ER 58% PR 0% HER-2 amplified ratio 3.16 Ki-67 80%   08/14/2014 -  Anti-estrogen oral therapy Continue with letrozole 2.5 mg daily patient was offered Herceptin but she refused, changed to tamoxifen 20 mg daily 11/14/2014 for musculoskeletal pain    CHIEF COMPLIANT: Diffuse musculoskeletal pain  INTERVAL  HISTORY: Amber [name] is a 81 year old African-American female with above-mentioned history of recurrent right breast cancer currently on oral antiestrogen therapy with letrozole. She was having excruciating musculoskeletal pain. Even though she does not attribute all of these symptoms to letrozole, I suspect that many of them are letrozole related. She denies any other problems. She does have nausea and vomiting issues that have happened a few times on letrozole.  REVIEW OF SYSTEMS:   Constitutional: Denies fevers, chills or abnormal weight loss Eyes: Denies blurriness of vision Ears, nose, mouth, throat, and face: Denies mucositis or sore throat Respiratory: Denies cough, dyspnea or wheezes Cardiovascular: Denies palpitation, chest discomfort or lower extremity swelling Gastrointestinal:  Denies nausea, heartburn or change in bowel habits Skin: Denies abnormal skin rashes Lymphatics: Denies new lymphadenopathy or easy bruising Neurological: Wheelchair-bound Behavioral/Psych: Mood is stable, no new changes  Breast:  denies any pain or lumps or nodules in either breasts All other systems were reviewed with the patient and are negative.  I have reviewed the past medical history, past surgical history, social history and family history with the patient and they are unchanged from previous note.  ALLERGIES:  has No Known Allergies.  MEDICATIONS:  Current Outpatient Prescriptions  Medication Sig Dispense Refill  . albuterol (PROVENTIL HFA;VENTOLIN HFA) 108 (90 BASE) MCG/ACT inhaler Inhale 1-2 puffs into the lungs every 4 (four) hours as needed for wheezing or shortness of breath.    Marland Kitchen amLODipine (NORVASC) 5 MG tablet TAKE ONE TABLET DAILY FOR HIGH BLOOD PRESSURE. 30 tablet 3  . aspirin 81 MG tablet Take 81 mg by mouth daily.    Marland Kitchen  atorvastatin (LIPITOR) 10 MG tablet Take 10 mg by mouth daily.    . cholecalciferol (VITAMIN D) 1000 UNITS tablet Take 1,000 Units by mouth daily.    . diazepam  (VALIUM) 5 MG tablet Take 1/2 tablet by mouth every 8 hours as needed for anxiety 45 tablet 5  . Fluticasone-Salmeterol (ADVAIR) 250-50 MCG/DOSE AEPB Inhale 1 puff into the lungs 2 (two) times daily.    . furosemide (LASIX) 80 MG tablet Take 80 mg by mouth every morning.     Marland Kitchen HYDROcodone-acetaminophen (NORCO/VICODIN) 5-325 MG per tablet Take 1 tablet by mouth every 4 (four) hours as needed for moderate pain.    Marland Kitchen HYDROcodone-acetaminophen (NORCO/VICODIN) 5-325 MG per tablet Take two tablets by mouth every 4 hours as needed for severe pain; Take one tablet by mouth every 4 hours as needed for moderate pain 360 tablet 0  . Linaclotide (LINZESS) 145 MCG CAPS capsule Take 145 mcg by mouth daily.    . potassium chloride SA (K-DUR,KLOR-CON) 20 MEQ tablet TAKE (2) TABLETS TWICE DAILY. 120 tablet 2  . prochlorperazine (COMPAZINE) 5 MG tablet Take 5 mg by mouth every 8 (eight) hours as needed for nausea or vomiting.    . propranolol (INDERAL) 40 MG tablet Take 40 mg by mouth every morning.     Marland Kitchen SYNTHROID 150 MCG tablet Take 150 mcg by mouth every morning.     . traMADol (ULTRAM) 50 MG tablet Take one tablet by mouth every 6 hours as needed for pain 120 tablet 5  . tamoxifen (NOLVADEX) 20 MG tablet Take 1 tablet (20 mg total) by mouth daily. 90 tablet 3   No current facility-administered medications for this visit.    PHYSICAL EXAMINATION: ECOG PERFORMANCE STATUS: 2 - Symptomatic, <50% confined to bed  Filed Vitals:   11/14/14 1349  BP: 119/59  Pulse: 83  Temp: 98.3 F (36.8 C)  Resp: 18   Filed Weights   11/14/14 1349  Weight: 179 lb 12.8 oz (81.557 kg)    GENERAL:alert, no distress and comfortable SKIN: skin color, texture, turgor are normal, no rashes or significant lesions EYES: normal, Conjunctiva are pink and non-injected, sclera clear OROPHARYNX:no exudate, no erythema and lips, buccal mucosa, and tongue normal  NECK: supple, thyroid normal size, non-tender, without  nodularity LYMPH:  no palpable lymphadenopathy in the cervical, axillary or inguinal LUNGS: clear to auscultation and percussion with normal breathing effort HEART: regular rate & rhythm and no murmurs and no lower extremity edema ABDOMEN:abdomen soft, non-tender and normal bowel sounds Musculoskeletal:no cyanosis of digits and no clubbing  NEURO: alert & oriented x 3 with fluent speech, no focal motor/sensory deficits  LABORATORY DATA:  I have reviewed the data as listed   Chemistry      Component Value Date/Time   NA 144 11/14/2014 1315   NA 148* 08/03/2014 0415   K 3.8 11/14/2014 1315   K 3.3* 08/03/2014 0415   CL 108 08/03/2014 0415   CL 103 04/01/2013 1039   CO2 29 11/14/2014 1315   CO2 30 08/03/2014 0415   BUN 14.7 11/14/2014 1315   BUN 11 08/03/2014 0415   CREATININE 0.9 11/14/2014 1315   CREATININE 0.91 08/03/2014 0415      Component Value Date/Time   CALCIUM 9.7 11/14/2014 1315   CALCIUM 8.9 08/03/2014 0415   ALKPHOS 163* 11/14/2014 1315   ALKPHOS 129* 07/31/2014 1500   AST 16 11/14/2014 1315   AST 19 07/31/2014 1500   ALT 9 11/14/2014 1315  ALT 11 07/31/2014 1500   BILITOT 0.46 11/14/2014 1315   BILITOT 0.5 07/31/2014 1500       Lab Results  Component Value Date   WBC 4.6 11/14/2014   HGB 13.6 11/14/2014   HCT 42.4 11/14/2014   MCV 92.9 11/14/2014   PLT 203 11/14/2014   NEUTROABS 2.7 11/14/2014   ASSESSMENT & PLAN:  Primary cancer of upper outer quadrant of right female breast Relapsed right breast cancer status post mastectomy: ER/PR positive PR negative HER-2 amplified ( August 2015)  Status post resection 08/01/2014 , patient refused adjuvant Herceptin and is currently on letrozole 2.5 mg daily  Letrozole toxicities: 1. Diffuse musculoskeletal pain 9/10 2. Intermittent nausea and vomiting After much discussion, we elected to switch her from omeprazole to tamoxifen 20 mg daily. She will take this for 3 months and follow-up with [place] to discuss side  effects.  Breast cancer surveillance: 1. Mammogram to be done September 2016 Return to clinic in 3 months for follow-up   No orders of the defined types were placed in this encounter.   The patient has a good understanding of the overall plan. she agrees with it. She will call with any problems that may develop before her next visit here.   Rulon Eisenmenger, MD 11/14/2014 2:05 PM

## 2014-11-14 NOTE — Telephone Encounter (Signed)
per pof to sch pt appt-gave pt copy of sch °

## 2014-11-15 ENCOUNTER — Telehealth: Payer: Self-pay | Admitting: Hematology and Oncology

## 2014-11-15 NOTE — Telephone Encounter (Signed)
pt cld stated left -vm-stated ignore found sch

## 2014-11-28 ENCOUNTER — Other Ambulatory Visit: Payer: Self-pay | Admitting: Adult Health

## 2014-12-11 ENCOUNTER — Telehealth: Payer: Self-pay | Admitting: Hematology and Oncology

## 2014-12-11 ENCOUNTER — Other Ambulatory Visit: Payer: Self-pay | Admitting: *Deleted

## 2014-12-11 ENCOUNTER — Telehealth: Payer: Self-pay | Admitting: *Deleted

## 2014-12-11 ENCOUNTER — Ambulatory Visit (HOSPITAL_BASED_OUTPATIENT_CLINIC_OR_DEPARTMENT_OTHER): Payer: Medicare PPO | Admitting: Hematology and Oncology

## 2014-12-11 VITALS — BP 128/51 | HR 63 | Temp 99.1°F | Resp 18 | Ht 62.0 in | Wt 181.4 lb

## 2014-12-11 DIAGNOSIS — R222 Localized swelling, mass and lump, trunk: Secondary | ICD-10-CM

## 2014-12-11 DIAGNOSIS — C50411 Malignant neoplasm of upper-outer quadrant of right female breast: Secondary | ICD-10-CM

## 2014-12-11 DIAGNOSIS — C773 Secondary and unspecified malignant neoplasm of axilla and upper limb lymph nodes: Secondary | ICD-10-CM

## 2014-12-11 NOTE — Progress Notes (Signed)
Patient Care Team: [name], MD as PCP - General Rulon Eisenmenger, MD as Consulting Physician (Hematology and Oncology) Blair Promise, MD as Consulting Physician (Radiation Oncology) Coralie Keens, MD as Consulting Physician (General Surgery)  DIAGNOSIS: Primary cancer of upper outer quadrant of right female breast   Staging form: Breast, AJCC 7th Edition     Clinical: No stage assigned - Unsigned     Pathologic: Stage IIIA (T2, N2a, cM0) - Signed by Rulon Eisenmenger, MD on 07/04/2014   SUMMARY OF ONCOLOGIC HISTORY:   Primary cancer of upper outer quadrant of right female breast   10/19/2012 Initial Diagnosis Cancer of upper-outer quadrant of female breast; IDC ER 7% PR 0% Ki-67 19% HER-2 positive ratio 2.83; lymph node biopsy positive for cancer   11/09/2012 Breast MRI 2.7 x 2.4 x 2.2 cm biopsy-proven invasive ductal carcinoma deep in the upper outer quadrant of the right breast.1.8 cm biopsy-proven metastatic level I right axillary lymph node.    11/23/2012 Surgery Right breast lumpectomy 2.7 cm moderately differentiated mammary cancer next ductal and lobular features associated LCIS, LVIS present margins negative   01/01/2013 - 02/22/2014 Radiation Therapy Radiation therapy to lumpectomy site   04/01/2013 -  Anti-estrogen oral therapy Letrozole 2.5 mg started 04/01/2013 plan is for 5 years of therapy   06/30/2014 Relapse/Recurrence Right breast mass biopsy invasive mammary cancer possibly lobular type ER 58% PR 0% Ki-67 18%   08/01/2014 Surgery Invasive mammary cancer with mammary carcinoma in situ margins negative lymphovascular invasion present dermal nipple involvement by tumor 8.5 cm ER 58% PR 0% HER-2 amplified ratio 3.16 Ki-67 80%   08/14/2014 -  Anti-estrogen oral therapy Continue with letrozole 2.5 mg daily patient was offered Herceptin but she refused, changed to tamoxifen 20 mg daily 11/14/2014 for musculoskeletal pain    CHIEF COMPLIANT: Concern for recurrent breast cancer  INTERVAL  HISTORY: Amber [name] is a 81 year old F American lady with above-mentioned history of recurrent breast cancer who felt an abnormality in the right chest wall and bilateral axilla and wanted to be checked up and came in today for evaluation. She has noticed that over the past 2-3 weeks of his increasing swelling of the right chest wall above the mastectomy scar in addition to tender spots underneath the axilla especially on the right as well as in the right supraclavicular area. The areas are raising her concern that this could be recurrent malignancy.  REVIEW OF SYSTEMS:   Constitutional: Denies fevers, chills or abnormal weight loss Eyes: Denies blurriness of vision Ears, nose, mouth, throat, and face: Denies mucositis or sore throat Respiratory: Denies cough, dyspnea or wheezes Cardiovascular: Denies palpitation, chest discomfort or lower extremity swelling Gastrointestinal:  Denies nausea, heartburn or change in bowel habits Skin: Denies abnormal skin rashes Lymphatics: Denies new lymphadenopathy or easy bruising Neurological: Wheelchair-bound Behavioral/Psych: Mood is stable, no new changes  Breast: Marked swelling about the mastectomy scar and tender abnormalities underneath the right axilla and the supraclavicular area All other systems were reviewed with the patient and are negative.  I have reviewed the past medical history, past surgical history, social history and family history with the patient and they are unchanged from previous note.  ALLERGIES:  has No Known Allergies.  MEDICATIONS:  Current Outpatient Prescriptions  Medication Sig Dispense Refill  . albuterol (PROVENTIL HFA;VENTOLIN HFA) 108 (90 BASE) MCG/ACT inhaler Inhale 1-2 puffs into the lungs every 4 (four) hours as needed for wheezing or shortness of breath.    Marland Kitchen  amLODipine (NORVASC) 5 MG tablet TAKE ONE TABLET DAILY FOR HIGH BLOOD PRESSURE. 30 tablet 3  . aspirin 81 MG tablet Take 81 mg by mouth daily.    Marland Kitchen  atorvastatin (LIPITOR) 10 MG tablet Take 10 mg by mouth daily.    . cholecalciferol (VITAMIN D) 1000 UNITS tablet Take 1,000 Units by mouth daily.    . diazepam (VALIUM) 5 MG tablet Take 1/2 tablet by mouth every 8 hours as needed for anxiety 45 tablet 5  . Fluticasone-Salmeterol (ADVAIR) 250-50 MCG/DOSE AEPB Inhale 1 puff into the lungs 2 (two) times daily.    . furosemide (LASIX) 80 MG tablet Take 80 mg by mouth every morning.     Marland Kitchen HYDROcodone-acetaminophen (NORCO/VICODIN) 5-325 MG per tablet Take 1 tablet by mouth every 4 (four) hours as needed for moderate pain.    Marland Kitchen levothyroxine (SYNTHROID, LEVOTHROID) 112 MCG tablet Take 112 mcg by mouth daily before breakfast.    . Linaclotide (LINZESS) 145 MCG CAPS capsule Take 145 mcg by mouth daily.    . potassium chloride SA (K-DUR,KLOR-CON) 20 MEQ tablet TAKE (2) TABLETS TWICE DAILY. 120 tablet 2  . prochlorperazine (COMPAZINE) 5 MG tablet Take 5 mg by mouth every 8 (eight) hours as needed for nausea or vomiting.    . propranolol (INDERAL) 40 MG tablet Take 40 mg by mouth every morning.     . tamoxifen (NOLVADEX) 20 MG tablet Take 1 tablet (20 mg total) by mouth daily. 90 tablet 3  . traMADol (ULTRAM) 50 MG tablet Take one tablet by mouth every 6 hours as needed for pain 120 tablet 5   No current facility-administered medications for this visit.    PHYSICAL EXAMINATION: ECOG PERFORMANCE STATUS: 2 - Symptomatic, <50% confined to bed  Filed Vitals:   12/11/14 1330  BP: 128/51  Pulse: 63  Temp: 99.1 F (37.3 C)  Resp: 18   Filed Weights   12/11/14 1330  Weight: 181 lb 6.4 oz (82.283 kg)    GENERAL:alert, no distress and comfortable SKIN: skin color, texture, turgor are normal, no rashes or significant lesions EYES: normal, Conjunctiva are pink and non-injected, sclera clear OROPHARYNX:no exudate, no erythema and lips, buccal mucosa, and tongue normal  NECK: supple, thyroid normal size, non-tender, without nodularity LYMPH:  no  palpable lymphadenopathy in the cervical, axillary or inguinal LUNGS: clear to auscultation and percussion with normal breathing effort HEART: regular rate & rhythm and no murmurs and no lower extremity edema ABDOMEN:abdomen soft, non-tender and normal bowel sounds Musculoskeletal:no cyanosis of digits and no clubbing  NEURO: alert & oriented x 3 with fluent speech, no focal motor/sensory deficits BREAST: About the right mastectomy scar there is a area of markedly swelling the size of my hand without any palpable abnormalities. Underneath the right axilla that are grainy type palpable nodularity which could be scar tissue versus malignancy. To palpation they are freely mobile and tender to touch. There are palpable small nodular right supraclavicular lymph node tissue. (exam performed in the presence of a chaperone)  LABORATORY DATA:  I have reviewed the data as listed   Chemistry      Component Value Date/Time   NA 144 11/14/2014 1315   NA 148* 08/03/2014 0415   K 3.8 11/14/2014 1315   K 3.3* 08/03/2014 0415   CL 108 08/03/2014 0415   CL 103 04/01/2013 1039   CO2 29 11/14/2014 1315   CO2 30 08/03/2014 0415   BUN 14.7 11/14/2014 1315   BUN 11 08/03/2014 0415  CREATININE 0.9 11/14/2014 1315   CREATININE 0.91 08/03/2014 0415      Component Value Date/Time   CALCIUM 9.7 11/14/2014 1315   CALCIUM 8.9 08/03/2014 0415   ALKPHOS 163* 11/14/2014 1315   ALKPHOS 129* 07/31/2014 1500   AST 16 11/14/2014 1315   AST 19 07/31/2014 1500   ALT 9 11/14/2014 1315   ALT 11 07/31/2014 1500   BILITOT 0.46 11/14/2014 1315   BILITOT 0.5 07/31/2014 1500       Lab Results  Component Value Date   WBC 4.6 11/14/2014   HGB 13.6 11/14/2014   HCT 42.4 11/14/2014   MCV 92.9 11/14/2014   PLT 203 11/14/2014   NEUTROABS 2.7 11/14/2014   ASSESSMENT & PLAN:  Primary cancer of upper outer quadrant of right female breast Relapsed right breast cancer status post mastectomy: ER/PR positive PR negative  HER-2 amplified ( August 2015) Status post resection 08/01/2014 , patient refused adjuvant Herceptin and was on letrozole 2.5 mg daily switched to tamoxifen 20 mg daily 11/14/2014  Right chest wall swelling with suspicious abnormal lymph nodes under the axilla and the supraclavicular area: About the mastectomy scar there is a large area of markedly increased swelling that feels like lymphedema, in addition under the right axilla there is small bead like palpable abnormalities which the patient considers to be recurrent cancer and being tender. Also grainy nodularity in the right supraclavicular medial aspect.  Recommendation: 1. Ultrasound evaluation of the chest wall and bilateral axilla at [facility] 2. Biopsy if any abnormalities were found 3. Follow-up after biopsy to discuss the results  If this is found to be recurrent malignancy and it is HER-2 positive, I will offer her single agent Herceptin and will combine that with tamoxifen.   No orders of the defined types were placed in this encounter.   The patient has a good understanding of the overall plan. she agrees with it. She will call with any problems that may develop before her next visit here.   Rulon Eisenmenger, MD

## 2014-12-11 NOTE — Assessment & Plan Note (Signed)
Relapsed right breast cancer status post mastectomy: ER/PR positive PR negative HER-2 amplified ( August 2015) Status post resection 08/01/2014 , patient refused adjuvant Herceptin and was on letrozole 2.5 mg daily switched to tamoxifen 20 mg daily 11/14/2014  Right chest wall swelling with suspicious abnormal lymph nodes under the axilla and the supraclavicular area: About the mastectomy scar there is a large area of markedly increased swelling that feels like lymphedema, in addition under the right axilla there is small bead like palpable abnormalities which the patient considers to be recurrent cancer and being tender. Also grainy nodularity in the right supraclavicular medial aspect.  Recommendation: 1. Ultrasound evaluation of the chest wall and bilateral axilla at [facility] 2. Biopsy if any abnormalities were found 3. Follow-up after biopsy to discuss the results  If this is found to be recurrent malignancy and it is HER-2 positive, I will offer her single agent Herceptin and will combine that with tamoxifen.

## 2014-12-11 NOTE — Telephone Encounter (Signed)
Received call from [place] with [facility]. She saw patient today to do a home assessment. Stated that patient had lymhadenopathy in the right calvicular and axillary area where patient had mastectomy. Discussed with Dr. Lindi Adie. Spoke with patient's daughter Hassan Rowan who will bring patient in to be seen at 1:00 today.

## 2014-12-11 NOTE — Telephone Encounter (Signed)
, °

## 2014-12-22 ENCOUNTER — Telehealth: Payer: Self-pay | Admitting: *Deleted

## 2014-12-22 ENCOUNTER — Telehealth: Payer: Self-pay | Admitting: Hematology and Oncology

## 2014-12-22 ENCOUNTER — Telehealth: Payer: Self-pay

## 2014-12-22 ENCOUNTER — Other Ambulatory Visit: Payer: Self-pay

## 2014-12-22 DIAGNOSIS — C50919 Malignant neoplasm of unspecified site of unspecified female breast: Secondary | ICD-10-CM

## 2014-12-22 DIAGNOSIS — C50411 Malignant neoplasm of upper-outer quadrant of right female breast: Secondary | ICD-10-CM

## 2014-12-22 NOTE — Telephone Encounter (Signed)
, °

## 2014-12-22 NOTE — Telephone Encounter (Signed)
Patient's daughter Hassan Rowan left voicemail requesting return call.  This nurse called.  Reports she has been awaiting call from Dr. Lindi Adie after [facility] recommended CT scan of breast when US performed.  "[facility] doesn't want to biopsy her breast, this will be her third time with breast cancer.  Also received a call yesterday from an 800 number I though twas a bill collector but they said no they couldn't talk to me about that and I was then given [phone] to call."  [phone] and [phone] are the numbers.  This nurse called these and they are Patient accounting department and [facility] accounting department respectively.  Collaborative nurse notified and will call daughter about scans.

## 2014-12-22 NOTE — Telephone Encounter (Signed)
Amber [name]. Spoke with her and let her know Dr. Lindi Adie was out of town, that we received the report from [place] today and Dr. Jana Hakim had reviewed, orders had been placed for CT and Biopsy - need to be pre-authorized then scheduled, and request for OV.  Confirm IV had been scheduled for 3/3.  Let Amber [name] know they should expect to hear from central scheduling about the studies.  Amber [name] voiced understanding.

## 2014-12-25 ENCOUNTER — Telehealth: Payer: Self-pay

## 2014-12-25 ENCOUNTER — Telehealth: Payer: Self-pay | Admitting: *Deleted

## 2014-12-25 NOTE — Telephone Encounter (Signed)
Received a call from patient's son, Lisset Ketchem.  He is concerned because there was a CT of the chest that was to be scheduled, but they have not heard from radiology to schedule that.  Her son is not listed as a HIPAA contact.  Called her daughter, Redmond Pulling to verify the information.  She stated that it is correct.  They have not received an appointment for the CT scan.

## 2014-12-25 NOTE — Telephone Encounter (Signed)
Pt son - Amber [name] - called in to clinic seeking information.  Amber [name] got on the phone and stated we could speak with him.  Let Amber [name] know that scans/studies had not been scheduled yet.  He requested a call with d/t so he can coordinate to be here on those days.  Provided contact numbers - added to chart.

## 2014-12-25 NOTE — Addendum Note (Signed)
Addended by: Prentiss Bells on: 12/25/2014 09:28 AM   Modules accepted: Orders

## 2014-12-27 ENCOUNTER — Other Ambulatory Visit: Payer: Self-pay | Admitting: *Deleted

## 2014-12-28 ENCOUNTER — Telehealth: Payer: Self-pay | Admitting: Hematology and Oncology

## 2014-12-28 ENCOUNTER — Other Ambulatory Visit: Payer: Self-pay | Admitting: *Deleted

## 2014-12-28 DIAGNOSIS — C50411 Malignant neoplasm of upper-outer quadrant of right female breast: Secondary | ICD-10-CM

## 2014-12-28 NOTE — Telephone Encounter (Signed)
s.w. pt and advised on added lab on 3.1.Marland KitchenMarland KitchenMarland Kitchenpt ok and aware

## 2015-01-02 ENCOUNTER — Other Ambulatory Visit (HOSPITAL_BASED_OUTPATIENT_CLINIC_OR_DEPARTMENT_OTHER): Payer: Medicare PPO

## 2015-01-02 ENCOUNTER — Ambulatory Visit (HOSPITAL_COMMUNITY)
Admission: RE | Admit: 2015-01-02 | Discharge: 2015-01-02 | Disposition: A | Discharge status: Home / Self Care | Payer: Medicare PPO | Source: Ambulatory Visit | Attending: Hematology and Oncology | Admitting: Hematology and Oncology

## 2015-01-02 ENCOUNTER — Encounter (HOSPITAL_COMMUNITY): Payer: Self-pay

## 2015-01-02 DIAGNOSIS — C50919 Malignant neoplasm of unspecified site of unspecified female breast: Secondary | ICD-10-CM

## 2015-01-02 DIAGNOSIS — R222 Localized swelling, mass and lump, trunk: Secondary | ICD-10-CM | POA: Insufficient documentation

## 2015-01-02 DIAGNOSIS — Z853 Personal history of malignant neoplasm of breast: Secondary | ICD-10-CM | POA: Diagnosis not present

## 2015-01-02 DIAGNOSIS — C50411 Malignant neoplasm of upper-outer quadrant of right female breast: Secondary | ICD-10-CM

## 2015-01-02 LAB — CBC WITH DIFFERENTIAL/PLATELET
BASO%: 0.6 % (ref 0.0–2.0)
Basophils Absolute: 0 10*3/uL (ref 0.0–0.1)
EOS%: 2 % (ref 0.0–7.0)
Eosinophils Absolute: 0.1 10*3/uL (ref 0.0–0.5)
HCT: 39.7 % (ref 34.8–46.6)
HEMOGLOBIN: 12.8 g/dL (ref 11.6–15.9)
LYMPH#: 1.1 10*3/uL (ref 0.9–3.3)
LYMPH%: 30.3 % (ref 14.0–49.7)
MCH: 30.2 pg (ref 25.1–34.0)
MCHC: 32.1 g/dL (ref 31.5–36.0)
MCV: 94 fL (ref 79.5–101.0)
MONO#: 0.3 10*3/uL (ref 0.1–0.9)
MONO%: 7.5 % (ref 0.0–14.0)
NEUT#: 2.2 10*3/uL (ref 1.5–6.5)
NEUT%: 59.6 % (ref 38.4–76.8)
Platelets: 161 10*3/uL (ref 145–400)
RBC: 4.23 10*6/uL (ref 3.70–5.45)
RDW: 14.2 % (ref 11.2–14.5)
WBC: 3.6 10*3/uL — ABNORMAL LOW (ref 3.9–10.3)

## 2015-01-02 LAB — COMPREHENSIVE METABOLIC PANEL (CC13)
ALBUMIN: 2.8 g/dL — AB (ref 3.5–5.0)
ALT: 6 U/L (ref 0–55)
AST: 16 U/L (ref 5–34)
Alkaline Phosphatase: 113 U/L (ref 40–150)
Anion Gap: 9 mEq/L (ref 3–11)
BUN: 17.8 mg/dL (ref 7.0–26.0)
CHLORIDE: 106 meq/L (ref 98–109)
CO2: 29 mEq/L (ref 22–29)
CREATININE: 1.1 mg/dL (ref 0.6–1.1)
Calcium: 9.4 mg/dL (ref 8.4–10.4)
EGFR: 54 mL/min/{1.73_m2} — ABNORMAL LOW (ref >90)
Glucose: 138 mg/dl (ref 70–140)
Potassium: 4.2 mEq/L (ref 3.5–5.1)
Sodium: 144 mEq/L (ref 136–145)
Total Bilirubin: 0.38 mg/dL (ref 0.20–1.20)
Total Protein: 6.1 g/dL — ABNORMAL LOW (ref 6.4–8.3)

## 2015-01-02 MED ORDER — IOHEXOL 300 MG/ML  SOLN
80.0000 mL | Freq: Once | INTRAMUSCULAR | Status: AC | PRN
  Administered 2015-01-02: 80 mL via INTRAVENOUS

## 2015-01-03 ENCOUNTER — Telehealth: Payer: Self-pay | Admitting: Hematology and Oncology

## 2015-01-03 ENCOUNTER — Telehealth: Payer: Self-pay | Admitting: *Deleted

## 2015-01-03 ENCOUNTER — Other Ambulatory Visit: Payer: Self-pay

## 2015-01-03 DIAGNOSIS — C50411 Malignant neoplasm of upper-outer quadrant of right female breast: Secondary | ICD-10-CM

## 2015-01-03 NOTE — Telephone Encounter (Signed)
Per 03/02 POF, pt scheduled for MD visit r/s from 03/03 to 03/11, pt is aware.... KJ

## 2015-01-03 NOTE — Telephone Encounter (Signed)
A NOTE WAS LEFT ON Amber [name]'S DESK WITH THE PHONE NUMBER OF PT.'S DAUGHTER.

## 2015-01-03 NOTE — Progress Notes (Signed)
Let daughter Hassan Rowan know that biopsy scheduled at [facility] 3/7 at 245.  Office Visit moved to 3/11 at 1 pm - pt voiced understanding. pof sent to schedule 3/11 and cxl 3/3.

## 2015-01-04 ENCOUNTER — Ambulatory Visit: Payer: Medicare PPO | Admitting: Hematology and Oncology

## 2015-01-08 ENCOUNTER — Other Ambulatory Visit: Payer: Self-pay

## 2015-01-12 ENCOUNTER — Ambulatory Visit (HOSPITAL_BASED_OUTPATIENT_CLINIC_OR_DEPARTMENT_OTHER): Payer: Medicare Other | Admitting: Hematology and Oncology

## 2015-01-12 DIAGNOSIS — C7989 Secondary malignant neoplasm of other specified sites: Secondary | ICD-10-CM | POA: Diagnosis not present

## 2015-01-12 DIAGNOSIS — C7951 Secondary malignant neoplasm of bone: Secondary | ICD-10-CM

## 2015-01-12 DIAGNOSIS — C50411 Malignant neoplasm of upper-outer quadrant of right female breast: Secondary | ICD-10-CM

## 2015-01-12 DIAGNOSIS — C773 Secondary and unspecified malignant neoplasm of axilla and upper limb lymph nodes: Secondary | ICD-10-CM | POA: Diagnosis not present

## 2015-01-12 MED ORDER — FENTANYL 25 MCG/HR TD PT72: 25.0000 ug | MEDICATED_PATCH | TRANSDERMAL | Status: AC

## 2015-01-12 NOTE — Progress Notes (Signed)
Referral made to Hospice and [place].  Spoke with Jeani Hawking, RN - requested records and reports she is meeting with patient today at 4 pm.  Faxed CT and path - sent to scan.

## 2015-01-12 NOTE — Assessment & Plan Note (Signed)
Relapsed/recurrent /metastatic breast cancer with right chest wall recurrence as well as left axillary lymph node metastases , bone metastases :   Pathology and radiology review: I discussed the pathology report from the recent biopsies which showed that the right data chest wall mass was found to be malignant with ER positive and HER-2 positive disease. Left axillary lymph node biopsy was technically difficult and most likely not representative of the lymph node. I also discussed the CT scan result with the patient.   Different treatment options: I offered the patient Herceptin plus letrozole versus hospice care. Patient understands fully well that her breast cancer is incurable and the treatments may not necessarily make a  Significant improvement in her quality of life because single agent Herceptin has a very low level of response rates. After lengthy discussion between the patient's family members and the patient, she elected to take up hospice care.   We sent a referral to hospice to meet with them tomorrow and to discuss all the  Hospice options which include home hospice versus nursing home hospice.

## 2015-01-12 NOTE — Progress Notes (Signed)
Patient Care Team: [name], MD as PCP - General Nicholas Lose, MD as Consulting Physician (Hematology and Oncology) Gery Pray, MD as Consulting Physician (Radiation Oncology) Coralie Keens, MD as Consulting Physician (General Surgery)  DIAGNOSIS: Primary cancer of upper outer quadrant of right female breast   Staging form: Breast, AJCC 7th Edition     Clinical: No stage assigned - Unsigned     Pathologic: Stage IIIA (T2, N2a, cM0) - Signed by Rulon Eisenmenger, MD on 07/04/2014   SUMMARY OF ONCOLOGIC HISTORY:   Primary cancer of upper outer quadrant of right female breast   10/19/2012 Initial Diagnosis Cancer of upper-outer quadrant of female breast; IDC ER 7% PR 0% Ki-67 19% HER-2 positive ratio 2.83; lymph node biopsy positive for cancer   11/09/2012 Breast MRI 2.7 x 2.4 x 2.2 cm biopsy-proven invasive ductal carcinoma deep in the upper outer quadrant of the right breast.1.8 cm biopsy-proven metastatic level I right axillary lymph node.    11/23/2012 Surgery Right breast lumpectomy 2.7 cm moderately differentiated mammary cancer next ductal and lobular features associated LCIS, LVIS present margins negative   01/01/2013 - 02/22/2014 Radiation Therapy Radiation therapy to lumpectomy site   04/01/2013 -  Anti-estrogen oral therapy Letrozole 2.5 mg started 04/01/2013 plan is for 5 years of therapy   06/30/2014 Relapse/Recurrence Right breast mass biopsy invasive mammary cancer possibly lobular type ER 58% PR 0% Ki-67 18%   08/01/2014 Surgery Invasive mammary cancer with mammary carcinoma in situ margins negative lymphovascular invasion present dermal nipple involvement by tumor 8.5 cm ER 58% PR 0% HER-2 amplified ratio 3.16 Ki-67 80%   08/14/2014 -  Anti-estrogen oral therapy Continue with letrozole 2.5 mg daily patient was offered Herceptin but she refused, changed to tamoxifen 20 mg daily 11/14/2014 for musculoskeletal pain   01/05/2015 Relapse/Recurrence  Right  chest wall biopsy: Recurrent breast  cancer ER 60% , HER-2 amplified   01/11/2015 Imaging  CT chest revealed left axillary lymph nodes 1.7 cm, subpectoral lymph node 7 mm, 3.2 cm lesion in the left sternum/manubrium, 2.1 x 1.2 cm lytic expansile lesion in the left T12 vertebral body    CHIEF COMPLIANT:  Follow-up to discuss recent biopsy and CT scans  INTERVAL HISTORY: Amber [name] is a  81 year old lady with above-mentioned history of relapsed refractory and recurrent breast cancers who presented with a right-sided chest wall problem and she underwent imaging studies which revealed right chest wall recurrence of breast cancer as well as left axillary lymphadenopathy along with bone metastases. She underwent a biopsy of the right chest wall which came back as invasive ductal carcinoma that was ER/PR positive and HER-2 positive. The left axillary lymph node biopsy did not sample the lymphoid tissue.  Patient complains of pain in her right hip as well as lower back. She is here today accompanied by her sisters and her brother. She is unable to get up and move and do any of her activities of daily living. She requires help with almost everything. Her brother reports that sometimes because and not always around, she gets neglected.  REVIEW OF SYSTEMS:   Constitutional: Denies fevers, chills or abnormal weight loss Eyes: Denies blurriness of vision Ears, nose, mouth, throat, and face: Denies mucositis or sore throat Respiratory: Denies cough, dyspnea or wheezes Cardiovascular: Denies palpitation, chest discomfort or lower extremity swelling Gastrointestinal:  Denies nausea, heartburn or change in bowel habits Skin: Denies abnormal skin rashes Lymphatics: Denies new lymphadenopathy or easy bruising Neurological:Denies numbness, tingling or new  weaknesses Behavioral/Psych: Mood is stable, no new changes  Breast:  Pain in the right side of the sternum and chest wall All other systems were reviewed with the patient and are  negative.  I have reviewed the past medical history, past surgical history, social history and family history with the patient and they are unchanged from previous note.  ALLERGIES:  has No Known Allergies.  MEDICATIONS:  Current Outpatient Prescriptions  Medication Sig Dispense Refill  . albuterol (PROVENTIL HFA;VENTOLIN HFA) 108 (90 BASE) MCG/ACT inhaler Inhale 1-2 puffs into the lungs every 4 (four) hours as needed for wheezing or shortness of breath.    Marland Kitchen amLODipine (NORVASC) 5 MG tablet TAKE ONE TABLET DAILY FOR HIGH BLOOD PRESSURE. 30 tablet 3  . aspirin 81 MG tablet Take 81 mg by mouth daily.    Marland Kitchen atorvastatin (LIPITOR) 10 MG tablet Take 10 mg by mouth daily.    . cholecalciferol (VITAMIN D) 1000 UNITS tablet Take 1,000 Units by mouth daily.    . diazepam (VALIUM) 5 MG tablet Take 1/2 tablet by mouth every 8 hours as needed for anxiety 45 tablet 5  . Fluticasone-Salmeterol (ADVAIR) 250-50 MCG/DOSE AEPB Inhale 1 puff into the lungs 2 (two) times daily.    . furosemide (LASIX) 80 MG tablet Take 80 mg by mouth every morning.     Marland Kitchen HYDROcodone-acetaminophen (NORCO/VICODIN) 5-325 MG per tablet Take 1 tablet by mouth every 4 (four) hours as needed for moderate pain.    Marland Kitchen letrozole (FEMARA) 2.5 MG tablet     . levothyroxine (SYNTHROID, LEVOTHROID) 112 MCG tablet Take 112 mcg by mouth daily before breakfast.    . Linaclotide (LINZESS) 145 MCG CAPS capsule Take 145 mcg by mouth daily.    . potassium chloride SA (K-DUR,KLOR-CON) 20 MEQ tablet TAKE (2) TABLETS TWICE DAILY. 120 tablet 2  . prochlorperazine (COMPAZINE) 5 MG tablet Take 5 mg by mouth every 8 (eight) hours as needed for nausea or vomiting.    . propranolol (INDERAL) 40 MG tablet Take 40 mg by mouth every morning.     . fentaNYL (DURAGESIC - DOSED MCG/HR) 25 MCG/HR patch Place 1 patch (25 mcg total) onto the skin every 3 (three) days. 10 patch 0   No current facility-administered medications for this visit.    PHYSICAL  EXAMINATION: ECOG PERFORMANCE STATUS: 3 - Symptomatic, >50% confined to bed  Filed Vitals:   01/12/15 1255  BP: 126/55  Pulse: 56  Temp: 98.6 F (37 C)  Resp: 18   Filed Weights    GENERAL:alert, no distress and comfortable SKIN: skin color, texture, turgor are normal, no rashes or significant lesions EYES: normal, Conjunctiva are pink and non-injected, sclera clear OROPHARYNX:no exudate, no erythema and lips, buccal mucosa, and tongue normal  NECK: supple, thyroid normal size, non-tender, without nodularity LYMPH:  no palpable lymphadenopathy in the cervical, axillary or inguinal LUNGS: clear to auscultation and percussion with normal breathing effort HEART: regular rate & rhythm and no murmurs and no lower extremity edema ABDOMEN:abdomen soft, non-tender and normal bowel sounds Musculoskeletal:no cyanosis of digits and no clubbing  NEURO: alert & oriented x 3 with fluent speech,  Inability to do much activity by herself. She requires assistance for all activities of daily living.  LABORATORY DATA:  I have reviewed the data as listed   Chemistry      Component Value Date/Time   NA 144 01/02/2015 0841   NA 148* 08/03/2014 0415   K 4.2 01/02/2015 0841   K  3.3* 08/03/2014 0415   CL 108 08/03/2014 0415   CL 103 04/01/2013 1039   CO2 29 01/02/2015 0841   CO2 30 08/03/2014 0415   BUN 17.8 01/02/2015 0841   BUN 11 08/03/2014 0415   CREATININE 1.1 01/02/2015 0841   CREATININE 0.91 08/03/2014 0415      Component Value Date/Time   CALCIUM 9.4 01/02/2015 0841   CALCIUM 8.9 08/03/2014 0415   ALKPHOS 113 01/02/2015 0841   ALKPHOS 129* 07/31/2014 1500   AST 16 01/02/2015 0841   AST 19 07/31/2014 1500   ALT 6 01/02/2015 0841   ALT 11 07/31/2014 1500   BILITOT 0.38 01/02/2015 0841   BILITOT 0.5 07/31/2014 1500       Lab Results  Component Value Date   WBC 3.6* 01/02/2015   HGB 12.8 01/02/2015   HCT 39.7 01/02/2015   MCV 94.0 01/02/2015   PLT 161 01/02/2015    NEUTROABS 2.2 01/02/2015     RADIOGRAPHIC STUDIES: I have personally reviewed the radiology reports and agreed with their findings.  CT chest showing bone metastases  ASSESSMENT & PLAN:  Primary cancer of upper outer quadrant of right female breast  Relapsed/recurrent /metastatic breast cancer with right chest wall recurrence as well as left axillary lymph node metastases , bone metastases :   Pathology and radiology review: I discussed the pathology report from the recent biopsies which showed that the right data chest wall mass was found to be malignant with ER positive and HER-2 positive disease. Left axillary lymph node biopsy was technically difficult and most likely not representative of the lymph node. I also discussed the CT scan result with the patient.   Different treatment options: I offered the patient Herceptin plus letrozole versus hospice care. Patient understands fully well that her breast cancer is incurable and the treatments may not necessarily make a  Significant improvement in her quality of life because single agent Herceptin has a very low level of response rates. After lengthy discussion between the patient's family members and the patient, she elected to take up hospice care.   We sent a referral to hospice to meet with them tomorrow and to discuss all the  Hospice options which include home hospice versus nursing home hospice.     Orders Placed This Encounter  Procedures  . Ambulatory referral to Hospice    Referral Priority:  Routine    Referral Type:  Consultation    Referral Reason:  Specialty Services Required    Requested Specialty:  Hospice Services    Number of Visits Requested:  1   The patient has a good understanding of the overall plan. she agrees with it. She will call with any problems that may develop before her next visit here.   Rulon Eisenmenger, MD

## 2015-01-15 ENCOUNTER — Telehealth: Payer: Self-pay | Admitting: *Deleted

## 2015-01-15 NOTE — Telephone Encounter (Signed)
Received call from [place] with [facility]. Reports that patient is having a lot of pain 10/10. Spoke with Dr. Lindi Adie and OK to increase Fentanyl patch to 50 mcg. Hospice MD to see patient for pain management on Wednesday. Judeen Hammans will call us to let us know if patient will need a rx for Fentanyl 50 mcg patches.

## 2015-01-25 ENCOUNTER — Telehealth: Payer: Self-pay

## 2015-01-25 NOTE — Telephone Encounter (Signed)
Ofc note dtd 01/12/15 from hospice rcvd by fax.  Reviewed by Dr Lindi Adie.  Sent to scan.

## 2015-02-05 ENCOUNTER — Encounter: Payer: Self-pay | Admitting: Hematology and Oncology

## 2015-02-06 ENCOUNTER — Telehealth: Payer: Self-pay

## 2015-02-06 NOTE — Telephone Encounter (Signed)
Care plan update dtd 3/24-3/30/16 rcvd from hospice.  Reviewed by Dr. Lindi Adie.  Sent to scan.  Care plan update dtd 3/17-3/23/16 rcvd from hospice.  Reviewed by Dr. Lindi Adie.  Sent to scan.

## 2015-02-07 ENCOUNTER — Telehealth: Payer: Self-pay

## 2015-02-07 NOTE — Telephone Encounter (Signed)
Orders faxed to Hospice.  Sent to scan.

## 2015-02-16 ENCOUNTER — Telehealth: Payer: Self-pay | Admitting: Hematology and Oncology

## 2015-02-16 NOTE — Telephone Encounter (Signed)
Patients daughter called in and left a message to cancel 4/18 appointment as she is under hospice care

## 2015-02-19 ENCOUNTER — Ambulatory Visit: Payer: Medicare PPO | Admitting: Hematology and Oncology

## 2015-02-20 ENCOUNTER — Telehealth: Payer: Self-pay

## 2015-02-20 ENCOUNTER — Other Ambulatory Visit: Payer: Self-pay | Admitting: Internal Medicine

## 2015-02-20 NOTE — Telephone Encounter (Signed)
Plan of Care updates rcvd from Hospice dtd 02/16/15.  Reviewed by Dr. Lindi Adie.  Sent to scan.

## 2015-02-23 ENCOUNTER — Telehealth: Payer: Self-pay

## 2015-02-23 NOTE — Telephone Encounter (Signed)
Hospice care plan notes received.  Reviewed by Dr. Lindi Adie.  Sent to scan.

## 2015-02-28 ENCOUNTER — Telehealth: Payer: Self-pay | Admitting: *Deleted

## 2015-02-28 NOTE — Telephone Encounter (Signed)
Sherri RN with Hospice called reporting Hospice has met with patient and need a Verbal order for DNR status and would like to know if Hospice provider can sign.  Observed 01-12-2015 office note plan.  Sherri RN given authorization for Hospice provider to sign DNR order.

## 2015-03-08 ENCOUNTER — Telehealth: Payer: Self-pay

## 2015-03-08 NOTE — Telephone Encounter (Signed)
Plan pf Care update rcvd from Hospice dtd 03/02/15.  Reviewed by Dr. Lindi Adie.  Sent to scan.

## 2015-04-03 ENCOUNTER — Telehealth: Payer: Self-pay

## 2015-04-03 NOTE — Telephone Encounter (Signed)
Lan of care updat dtd 03/22/15 rcvd from Hospice.  Reviewed by Dr. Lindi Adie.  Sent to scan.

## 2015-04-16 ENCOUNTER — Telehealth: Payer: Self-pay

## 2015-04-16 NOTE — Telephone Encounter (Signed)
Plan of care update rcvd from hospice dtd 04/04/15.  Reviewed by Dr. Lindi Adie, Sent to scan.

## 2015-04-23 ENCOUNTER — Telehealth: Payer: Self-pay

## 2015-04-23 ENCOUNTER — Telehealth: Payer: Self-pay | Admitting: Hematology and Oncology

## 2015-04-23 NOTE — Telephone Encounter (Signed)
Received death certificate [birth year]

## 2015-04-23 NOTE — Telephone Encounter (Signed)
Plan of Care update rcvd from Hospice.  Reviewed by Dr. Lindi Adie.  Sent to scan.

## 2015-04-24 ENCOUNTER — Telehealth: Payer: Self-pay

## 2015-04-24 NOTE — Telephone Encounter (Signed)
Plan of care orders faxed to hospice.  Sent to scan.

## 2015-04-24 NOTE — Telephone Encounter (Signed)
Supplemental order faxed to hospice.  Sent to scan.

## 2015-04-24 NOTE — Telephone Encounter (Signed)
Order for [facility] faxed to hospice.  Sent to scan.

## 2015-04-24 NOTE — Telephone Encounter (Signed)
Order for foley faxed to hospice.  Sent to scan.

## 2015-04-30 ENCOUNTER — Telehealth: Payer: Self-pay

## 2015-04-30 NOTE — Telephone Encounter (Signed)
Discharge rcvd from hospice dtd 05-10-15.  Notificaiton of death rcvd from Hospice dtd May 08, 2015.  Reviewed by Dr. Lindi Adie, Sent to scan.

## 2015-05-04 DEATH — deceased
